# Patient Record
Sex: Male | Born: 1965 | Hispanic: No | Marital: Single | Smoking: Never smoker
Health system: Southern US, Community
[De-identification: ages and names within clinical notes are randomized; demographics above are authoritative.]

## PROBLEM LIST (undated history)

## (undated) DIAGNOSIS — E785 Hyperlipidemia, unspecified: Secondary | ICD-10-CM

## (undated) DIAGNOSIS — I890 Lymphedema, not elsewhere classified: Secondary | ICD-10-CM

## (undated) DIAGNOSIS — I10 Essential (primary) hypertension: Secondary | ICD-10-CM

## (undated) HISTORY — PX: NO PAST SURGERIES: SHX2092

## (undated) HISTORY — DX: Essential (primary) hypertension: I10

## (undated) HISTORY — DX: Hyperlipidemia, unspecified: E78.5

---

## 2004-01-14 ENCOUNTER — Emergency Department (HOSPITAL_COMMUNITY): Admission: EM | Admit: 2004-01-14 | Discharge: 2004-01-14 | Payer: Self-pay | Admitting: Emergency Medicine

## 2006-11-21 ENCOUNTER — Emergency Department (HOSPITAL_COMMUNITY): Admission: EM | Admit: 2006-11-21 | Discharge: 2006-11-21 | Payer: Self-pay | Admitting: Emergency Medicine

## 2008-02-28 DIAGNOSIS — I10 Essential (primary) hypertension: Secondary | ICD-10-CM

## 2008-02-28 HISTORY — DX: Essential (primary) hypertension: I10

## 2008-07-14 ENCOUNTER — Ambulatory Visit: Payer: Self-pay | Admitting: Vascular Surgery

## 2008-07-14 ENCOUNTER — Observation Stay (HOSPITAL_COMMUNITY): Admission: EM | Admit: 2008-07-14 | Discharge: 2008-07-16 | Payer: Self-pay | Admitting: Emergency Medicine

## 2008-07-14 ENCOUNTER — Encounter (INDEPENDENT_AMBULATORY_CARE_PROVIDER_SITE_OTHER): Payer: Self-pay | Admitting: Emergency Medicine

## 2009-01-07 ENCOUNTER — Emergency Department (HOSPITAL_COMMUNITY): Admission: EM | Admit: 2009-01-07 | Discharge: 2009-01-07 | Payer: Self-pay | Admitting: Emergency Medicine

## 2009-02-27 DIAGNOSIS — E785 Hyperlipidemia, unspecified: Secondary | ICD-10-CM

## 2009-02-27 HISTORY — DX: Hyperlipidemia, unspecified: E78.5

## 2009-06-24 ENCOUNTER — Emergency Department (HOSPITAL_COMMUNITY): Admission: EM | Admit: 2009-06-24 | Discharge: 2009-06-24 | Payer: Self-pay | Admitting: Emergency Medicine

## 2009-09-28 ENCOUNTER — Emergency Department (HOSPITAL_COMMUNITY): Admission: EM | Admit: 2009-09-28 | Discharge: 2009-09-28 | Payer: Self-pay | Admitting: Emergency Medicine

## 2009-10-12 ENCOUNTER — Observation Stay (HOSPITAL_COMMUNITY): Admission: EM | Admit: 2009-10-12 | Discharge: 2009-10-13 | Payer: Self-pay | Admitting: Emergency Medicine

## 2009-10-13 ENCOUNTER — Ambulatory Visit: Payer: Self-pay | Admitting: Vascular Surgery

## 2009-10-13 ENCOUNTER — Encounter (INDEPENDENT_AMBULATORY_CARE_PROVIDER_SITE_OTHER): Payer: Self-pay | Admitting: Emergency Medicine

## 2009-10-16 ENCOUNTER — Emergency Department (HOSPITAL_COMMUNITY): Admission: EM | Admit: 2009-10-16 | Discharge: 2009-10-16 | Payer: Self-pay | Admitting: Emergency Medicine

## 2010-05-13 LAB — DIFFERENTIAL
Basophils Relative: 0 % (ref 0–1)
Eosinophils Absolute: 0 10*3/uL (ref 0.0–0.7)
Eosinophils Absolute: 0 10*3/uL (ref 0.0–0.7)
Eosinophils Relative: 0 % (ref 0–5)
Eosinophils Relative: 0 % (ref 0–5)
Lymphocytes Relative: 10 % — ABNORMAL LOW (ref 12–46)
Lymphocytes Relative: 8 % — ABNORMAL LOW (ref 12–46)
Lymphs Abs: 1.5 10*3/uL (ref 0.7–4.0)
Monocytes Absolute: 0.9 10*3/uL (ref 0.1–1.0)
Monocytes Relative: 5 % (ref 3–12)
Neutro Abs: 14 10*3/uL — ABNORMAL HIGH (ref 1.7–7.7)
Neutrophils Relative %: 85 % — ABNORMAL HIGH (ref 43–77)

## 2010-05-13 LAB — POCT I-STAT, CHEM 8
BUN: 19 mg/dL (ref 6–23)
Calcium, Ion: 0.95 mmol/L — ABNORMAL LOW (ref 1.12–1.32)
Creatinine, Ser: 1.6 mg/dL — ABNORMAL HIGH (ref 0.4–1.5)
Glucose, Bld: 117 mg/dL — ABNORMAL HIGH (ref 70–99)
Hemoglobin: 15 g/dL (ref 13.0–17.0)
TCO2: 21 mmol/L (ref 0–100)

## 2010-05-13 LAB — BASIC METABOLIC PANEL
CO2: 27 mEq/L (ref 19–32)
GFR calc Af Amer: >60 mL/min (ref >60)
GFR calc non Af Amer: 58 mL/min — ABNORMAL LOW (ref >60)
Glucose, Bld: 97 mg/dL (ref 70–99)
Potassium: 3.9 mEq/L (ref 3.5–5.1)
Sodium: 137 mEq/L (ref 135–145)

## 2010-05-13 LAB — CBC
HCT: 39.5 % (ref 39.0–52.0)
HCT: 41.6 % (ref 39.0–52.0)
Hemoglobin: 13.4 g/dL (ref 13.0–17.0)
MCH: 31.3 pg (ref 26.0–34.0)
MCH: 32.3 pg (ref 26.0–34.0)
MCHC: 33.9 g/dL (ref 30.0–36.0)
MCV: 92 fL (ref 78.0–100.0)
Platelets: 155 10*3/uL (ref 150–400)
RBC: 4.28 MIL/uL (ref 4.22–5.81)
RBC: 4.52 MIL/uL (ref 4.22–5.81)
WBC: 19 10*3/uL — ABNORMAL HIGH (ref 4.0–10.5)

## 2010-06-01 LAB — CBC
HCT: 41.8 % (ref 39.0–52.0)
MCHC: 33.8 g/dL (ref 30.0–36.0)
MCV: 91.9 fL (ref 78.0–100.0)
Platelets: 231 10*3/uL (ref 150–400)
RDW: 12.4 % (ref 11.5–15.5)
WBC: 16.1 10*3/uL — ABNORMAL HIGH (ref 4.0–10.5)

## 2010-06-01 LAB — DIFFERENTIAL
Basophils Relative: 0 % (ref 0–1)
Eosinophils Absolute: 0 10*3/uL (ref 0.0–0.7)
Eosinophils Relative: 0 % (ref 0–5)
Lymphs Abs: 0.8 10*3/uL (ref 0.7–4.0)
Neutrophils Relative %: 92 % — ABNORMAL HIGH (ref 43–77)

## 2010-06-07 LAB — CULTURE, BLOOD (ROUTINE X 2): Culture: NO GROWTH

## 2010-06-07 LAB — BASIC METABOLIC PANEL
BUN: 18 mg/dL (ref 6–23)
CO2: 28 mEq/L (ref 19–32)
CO2: 29 mEq/L (ref 19–32)
Calcium: 8.6 mg/dL (ref 8.4–10.5)
Calcium: 9.2 mg/dL (ref 8.4–10.5)
Chloride: 106 mEq/L (ref 96–112)
Creatinine, Ser: 1.37 mg/dL (ref 0.4–1.5)
GFR calc Af Amer: >60 mL/min (ref >60)
GFR calc non Af Amer: 56 mL/min — ABNORMAL LOW (ref >60)
GFR calc non Af Amer: >60 mL/min (ref >60)
Glucose, Bld: 100 mg/dL — ABNORMAL HIGH (ref 70–99)
Glucose, Bld: 114 mg/dL — ABNORMAL HIGH (ref 70–99)
Glucose, Bld: 95 mg/dL (ref 70–99)
Potassium: 3.6 mEq/L (ref 3.5–5.1)
Potassium: 4 mEq/L (ref 3.5–5.1)
Sodium: 138 mEq/L (ref 135–145)

## 2010-06-07 LAB — CBC
HCT: 36.1 % — ABNORMAL LOW (ref 39.0–52.0)
HCT: 38.6 % — ABNORMAL LOW (ref 39.0–52.0)
Hemoglobin: 13 g/dL (ref 13.0–17.0)
MCHC: 33.7 g/dL (ref 30.0–36.0)
MCHC: 33.8 g/dL (ref 30.0–36.0)
MCV: 93.4 fL (ref 78.0–100.0)
Platelets: 132 10*3/uL — ABNORMAL LOW (ref 150–400)
RBC: 4.7 MIL/uL (ref 4.22–5.81)
RDW: 13.2 % (ref 11.5–15.5)
RDW: 13.5 % (ref 11.5–15.5)
RDW: 13.6 % (ref 11.5–15.5)

## 2010-06-07 LAB — DIFFERENTIAL
Basophils Absolute: 0 10*3/uL (ref 0.0–0.1)
Basophils Relative: 0 % (ref 0–1)
Lymphocytes Relative: 12 % (ref 12–46)
Neutro Abs: 9.9 10*3/uL — ABNORMAL HIGH (ref 1.7–7.7)
Neutrophils Relative %: 84 % — ABNORMAL HIGH (ref 43–77)

## 2010-07-12 NOTE — Discharge Summary (Signed)
NAMEKALIK, HOARE NO.:  [id number]   MEDICAL RECORD NO.:  [id number]          PATIENT TYPE:  OBV   LOCATION:  1305                         FACILITY:  [facility]   PHYSICIAN:  Marcellus Scott, MD     DATE OF BIRTH:  04-02-1965   DATE OF ADMISSION:  07/14/2008  DATE OF DISCHARGE:  07/16/2008                               DISCHARGE SUMMARY   PRIMARY MEDICAL DOCTOR:  Gentry Fitz.   DISCHARGE DIAGNOSES:  1. Cellulitis of the left lower extremity complicating underlying      lymphedema.  2. Hypokalemia, repleted.  3. Thrombocytopenia, improving.  4. Hypertension, controlled off medications.  5. Lymphedema of the left lower extremity.   DISCHARGE MEDICATIONS:  1. Doxycycline 100 mg p.o. b.i.d. for 1 week.  2. Tylenol 650 mg p.o. every 4 to 6 hours p.r.n.   PROCEDURES:  1. Left lower extremity venous Doppler.  In summary, no evidence of      deep vein or superficial thrombosis involving the left lower      extremity.  An enlarged inguinal lymph node is visualized.  No      evidence of Baker cyst on the left.  2. CT angiogram of the chest on Jul 14, 2008.  Impression:      a.     No evidence of acute pulmonary embolism.      b.     No acute pulmonary parenchymal abnormality.      c.     Lucent lesion within the manubrium is likely benign finding       in patient in this age group.   PERTINENT LABS:  Blood cultures x2 are no growth to date.  Basic  metabolic panel today unremarkable with BUN 18, creatinine 1.20.  CBC is  remarkable for hemoglobin 12.4, hematocrit 36.1, white blood cell 5.9,  platelets 132.   CONSULTATIONS:  None.   HOSPITAL COURSE AND PATIENT DISPOSITION:  Mr. Angerer is a pleasant 45-  year-old male patient with history of left lower extremity lymphedema,  prior history of cellulitis in that leg, hypertension, controlled on  diet alone.  He works at [employer] on [address] [employer],  [place], [place].  He presented with left  lower extremity  redness, swelling, which started on the morning of admission.  He denied  fever, chills, or rigors.  He denied any recent trauma to the leg but  did indicate that he had treated himself for athlete's foot in that leg  a couple of weeks ago.  He had some throbbing pain and difficulty  ambulation.  Venous Doppler of the left lower extremity in the ED was  negative for deep vein thrombosis.  We were asked to admit this patient  for management of cellulitis.  The patient was admitted to the medical  floor.  1. Left lower extremity cellulitis complicating underlying lymphedema.      The patient was admitted to the hospital.  He was not toxic looking      and was hemodynamically stable.  There were no obvious wounds      including  the web spaces.  He was placed on IV vancomycin.  Within      the first 24 hours, there was significant improvement in his      symptoms and signs.  Approximately 48 hours later today, patient      has faint redness below the knee which is circumferential and has      mild warmth.  There is no tenderness.  There is no fluctuance or      crepitus.  Patient has no pain.  He is ambulating in the room.      Physical therapy and occupational therapy have evaluated the      patient and do not see any further needs.  Patient will be      transitioned to p.o. doxycycline to complete an additional week's      course.  We expect full recovery to his baseline.  He has been      advised to return to work on Monday, Jul 20, 2008, if his leg      continues to make improvement and his cellulitis resolves.      However, if there is any worsening of his symptoms, he has been      advised to seek immediate medical attention.  A letter has been      provided to his employer.  2. Hypokalemia, repleted.  3. Thrombocytopenia, improving, ? related to cellulitis.  Recommend      repeating CBC in 1 to 2 weeks to follow up.  4. Hypertension, controlled off medications.  5.  Lymphedema of the left lower extremity.  6. Acute renal insufficiency which has resolved.  ? etiology.  7. Mild anemia.  For outpatient followup and workup as deemed      necessary.   Patient indicates that he lacks insurance and primary medical doctor.  We will have case manager provide him with resources to be seen at  [facility].   TIME TAKEN IN COORDINATING THIS DISCHARGE:  20 minutes.      Marcellus Scott, MD  Electronically Signed     AH/MEDQ  D:  07/16/2008  T:  07/16/2008  Job:  [id number]   cc:   Melvern Banker  Fax: [phone]

## 2010-07-12 NOTE — H&P (Signed)
Brian Wade, [name]:  [id number]   MEDICAL RECORD NO.:  [id number]          PATIENT TYPE:  EMS   LOCATION:  ED                           FACILITY:  [facility]   PHYSICIAN:  Richarda Overlie, MD       DATE OF BIRTH:  10-26-1965   DATE OF ADMISSION:  07/14/2008  DATE OF DISCHARGE:                              HISTORY & PHYSICAL   PRIMARY CARE PHYSICIAN:  Unassigned.   CHIEF COMPLAINT:  Left lower extremity erythema.   SUBJECTIVE:  This is a 45 year old male who presents with a chief  complaint of left lower extremity swelling, erythema, induration which  was fairly acute in onset and started this morning.  He has a history of  lymphedema and had a similar episode 1-1/2 years ago.  He denies any  fever, chills and rigors at home and was found to be afebrile in the ER.  He denies any history of trauma to his leg, denies any history of  diabetes.  Denies any chills or rigors at home.  Denies any history of  DVT in the past.  He describes a throbbing pain and some difficulty with  ambulation.  The patient had a duplex ultrasound of his left lower  extremity in the ER which was essentially negative for DVT on  preliminary evaluation.   PAST MEDICAL HISTORY:  1. History of lymphedema.  2. History of hypertension.   SOCIAL HISTORY:  Nonsmoker.  Denies use of alcohol.  Denies any IV drug  use.   FAMILY HISTORY:  Positive for hypertension in the mother who is still  alive.  No history of dyslipidemia, diabetes.   SURGICAL HISTORY:  None.   HOME MEDICATIONS:  None.   PHYSICAL EXAMINATION:  VITAL SIGNS:  Temperature 98.5, blood pressure  124/86, pulse of 120, respirations 20.  GENERAL:  The patient appears to be comfortable in no acute distress.  HEENT:  Pupils equal and reactive.  Extraocular movements intact.  LUNGS:  Clear to auscultation bilaterally.  No wheezes, crackles or  rhonchi.  CARDIOVASCULAR: Regular rate and rhythm.  No appreciable murmurs, rubs  or  gallops.  ABDOMEN:  Soft, nontender, nondistended.  EXTREMITIES:  Left lower extremity with 2+ pitting edema and erythema  extending from around his ankle all the way to the anterior aspects of  his left knee.  MUSCULOSKELETAL:  The left knee appears to have full range of motion  without any erythema effusion of the left knee.  PSYCHIATRIC:  Appropriate mood and affect.   LABORATORY DATA:  CT scan shows no evidence of acute pulmonary emboli.  BMET:  Sodium 139, potassium 3.3, chloride 102, bicarb 29, glucose 114,  BUN 21, creatinine 1.37, calcium 9.2.  CBC:  WBC 11.8, hemoglobin 14.8,  hematocrit 43.6, platelet count of 134.  Duplex ultrasound:  Formal results still pending.   REVIEW OF SYSTEMS:  10-point review of systems was done as documented as  in HPI.   ASSESSMENT AND PLAN:  Left lower extremity cellulitis.  Do not see any  evidence of crepitus or necrotizing fasciitis.  The patient  is afebrile  currently.  Blood cultures will be obtained.  Duplex ultrasound was  negative for DVT.  We will start the patient on vancomycin.  The patient  can be transitioned to p.o. Bactrim after his erythema and induration  improves and to be continued for the next 10 days.  We will use heparin  for deep vein thrombosis prophylaxis.  He is a full code.      Richarda Overlie, MD  Electronically Signed     NA/MEDQ  D:  07/14/2008  T:  07/14/2008  Job:  [id number]

## 2010-10-20 ENCOUNTER — Emergency Department (HOSPITAL_COMMUNITY): Payer: Self-pay

## 2010-10-20 ENCOUNTER — Emergency Department (HOSPITAL_COMMUNITY)
Admission: EM | Admit: 2010-10-20 | Discharge: 2010-10-20 | Disposition: A | Discharge status: Home / Self Care | Payer: Self-pay | Attending: Emergency Medicine | Admitting: Emergency Medicine

## 2010-10-20 DIAGNOSIS — I872 Venous insufficiency (chronic) (peripheral): Secondary | ICD-10-CM | POA: Insufficient documentation

## 2010-10-20 DIAGNOSIS — M545 Low back pain, unspecified: Secondary | ICD-10-CM | POA: Insufficient documentation

## 2010-10-20 DIAGNOSIS — M79609 Pain in unspecified limb: Secondary | ICD-10-CM | POA: Insufficient documentation

## 2010-10-20 DIAGNOSIS — M51379 Other intervertebral disc degeneration, lumbosacral region without mention of lumbar back pain or lower extremity pain: Secondary | ICD-10-CM | POA: Insufficient documentation

## 2010-10-20 DIAGNOSIS — I10 Essential (primary) hypertension: Secondary | ICD-10-CM | POA: Insufficient documentation

## 2010-10-20 DIAGNOSIS — M5137 Other intervertebral disc degeneration, lumbosacral region: Secondary | ICD-10-CM | POA: Insufficient documentation

## 2010-10-20 LAB — URINALYSIS, ROUTINE W REFLEX MICROSCOPIC
Glucose, UA: NEGATIVE mg/dL
Ketones, ur: NEGATIVE mg/dL
Protein, ur: NEGATIVE mg/dL

## 2010-10-20 LAB — DIFFERENTIAL
Basophils Absolute: 0.1 10*3/uL (ref 0.0–0.1)
Eosinophils Relative: 4 % (ref 0–5)
Lymphocytes Relative: 43 % (ref 12–46)

## 2010-10-20 LAB — CBC
HCT: 44.2 % (ref 39.0–52.0)
Platelets: 213 10*3/uL (ref 150–400)
RDW: 12.4 % (ref 11.5–15.5)
WBC: 6 10*3/uL (ref 4.0–10.5)

## 2010-10-20 LAB — COMPREHENSIVE METABOLIC PANEL
ALT: 28 U/L (ref 0–53)
AST: 26 U/L (ref 0–37)
Albumin: 3.9 g/dL (ref 3.5–5.2)
Alkaline Phosphatase: 79 U/L (ref 39–117)
Chloride: 101 mEq/L (ref 96–112)
Potassium: 4.6 mEq/L (ref 3.5–5.1)
Sodium: 136 mEq/L (ref 135–145)
Total Bilirubin: 0.5 mg/dL (ref 0.3–1.2)

## 2012-01-16 ENCOUNTER — Emergency Department (HOSPITAL_COMMUNITY)
Admission: EM | Admit: 2012-01-16 | Discharge: 2012-01-16 | Disposition: A | Discharge status: Home / Self Care | Payer: Self-pay | Attending: Emergency Medicine | Admitting: Emergency Medicine

## 2012-01-16 ENCOUNTER — Encounter (HOSPITAL_COMMUNITY): Payer: Self-pay | Admitting: Emergency Medicine

## 2012-01-16 DIAGNOSIS — R509 Fever, unspecified: Secondary | ICD-10-CM | POA: Insufficient documentation

## 2012-01-16 DIAGNOSIS — L0390 Cellulitis, unspecified: Secondary | ICD-10-CM

## 2012-01-16 DIAGNOSIS — Z79899 Other long term (current) drug therapy: Secondary | ICD-10-CM | POA: Insufficient documentation

## 2012-01-16 DIAGNOSIS — R1110 Vomiting, unspecified: Secondary | ICD-10-CM | POA: Insufficient documentation

## 2012-01-16 DIAGNOSIS — L02419 Cutaneous abscess of limb, unspecified: Secondary | ICD-10-CM | POA: Insufficient documentation

## 2012-01-16 DIAGNOSIS — R197 Diarrhea, unspecified: Secondary | ICD-10-CM | POA: Insufficient documentation

## 2012-01-16 DIAGNOSIS — Z862 Personal history of diseases of the blood and blood-forming organs and certain disorders involving the immune mechanism: Secondary | ICD-10-CM | POA: Insufficient documentation

## 2012-01-16 HISTORY — DX: Lymphedema, not elsewhere classified: I89.0

## 2012-01-16 MED ORDER — CEPHALEXIN 250 MG PO CAPS
500.0000 mg | ORAL_CAPSULE | Freq: Once | ORAL | Status: AC
  Administered 2012-01-16: 500 mg via ORAL
  Filled 2012-01-16: qty 2

## 2012-01-16 MED ORDER — CEPHALEXIN 500 MG PO CAPS: 500.0000 mg | ORAL_CAPSULE | Freq: Four times a day (QID) | ORAL | Status: DC

## 2012-01-16 MED ORDER — IBUPROFEN 400 MG PO TABS
600.0000 mg | ORAL_TABLET | Freq: Once | ORAL | Status: DC
  Filled 2012-01-16: qty 1

## 2012-01-16 MED ORDER — ACETAMINOPHEN 325 MG PO TABS
975.0000 mg | ORAL_TABLET | Freq: Once | ORAL | Status: AC
  Administered 2012-01-16: 975 mg via ORAL
  Filled 2012-01-16: qty 3

## 2012-01-16 NOTE — ED Provider Notes (Signed)
History   This chart was scribed for Brian Sou, MD by Brian Wade, ED Scribe. This patient was seen in room TR06C/TR06C and the patient's care was started at 2:03PM.   CSN: [id number]  Arrival date & time 01/16/12  1233   First MD Initiated Contact with Patient 01/16/12 1403      No chief complaint on file.    The history is provided by the patient. No language interpreter was used.    Brian Wade is a 46 y.o. male who presents to the Emergency Department complaining of redness in the left lower leg starting 1-2 day ago. Pt states having associated pain in the groin area. Pt is positive for fever, chills, diarrhea, 1 episode vomiting yesterday morning. Pt is negative for headaches, nausea, sore throat. Pt has h/o lymphedema since 2005 and chronic venous insufficiency and reports prior episodes of similar symptoms diagnosed as cellulitis. He has been treated with Keflex and Doxycycline in the past with improvement. He denies taking any over the counter medications to improve his symptoms. Patient had subjective fever yesterday. He feels much improved today over yesterday, without treatment. Denies nausea at present pain at left leg is 4 on a scale of 1-10, nonradiating  Pt's PCP is Dr. [name].   Past Medical History  Diagnosis Date  . Lymphedema     No past surgical history on file.  No family history on file.  History  Substance Use Topics  . Smoking status: Never Smoker   . Smokeless tobacco: Not on file  . Alcohol Use: No      Review of Systems  Constitutional: Positive for fever and chills.  HENT: Negative.  Negative for sore throat.   Respiratory: Negative.   Cardiovascular: Negative.   Gastrointestinal: Positive for vomiting and diarrhea. Negative for abdominal pain.  Musculoskeletal: Negative.   Skin: Negative.   Neurological: Negative.   Hematological: Negative.   Psychiatric/Behavioral: Negative.     Allergies  Penicillins  Home Medications    Current Outpatient Rx  Name  Route  Sig  Dispense  Refill  . AMLODIPINE BESYLATE 10 MG PO TABS   Oral   Take 10 mg by mouth daily.         . ATENOLOL 25 MG PO TABS   Oral   Take 25 mg by mouth daily.         Marland Kitchen HYDROCHLOROTHIAZIDE 25 MG PO TABS   Oral   Take 25 mg by mouth daily.         Marland Kitchen LOVASTATIN 20 MG PO TABS   Oral   Take 20 mg by mouth daily.           BP 135/89  Pulse 89  Temp 98.1 F (36.7 C)  Resp 16  Physical Exam  Nursing note and vitals reviewed. Constitutional: He appears well-developed and well-nourished.  HENT:  Head: Normocephalic and atraumatic.  Right Ear: External ear normal.  Left Ear: External ear normal.  Eyes: Conjunctivae normal are normal. Pupils are equal, round, and reactive to light.  Neck: Neck supple. No tracheal deviation present. No thyromegaly present.  Cardiovascular: Normal rate, regular rhythm and normal heart sounds.   No murmur heard. Pulmonary/Chest: Effort normal and breath sounds normal.  Abdominal: Soft. Bowel sounds are normal. He exhibits no distension. There is no tenderness.  Musculoskeletal: Normal range of motion. He exhibits no edema and no tenderness.       Redness to the distal 2/3 of lower leg.  dp and  pt pulses are 2+ Tender inguinal nodes on the left.  No red streaking of the leg.   Neurological: He is alert. Coordination normal.  Skin: Skin is warm and dry. No rash noted. There is erythema.       Erythematous left leg otherwise without rash  Psychiatric: He has a normal mood and affect.    ED Course  Procedures (including critical care time)  DIAGNOSTIC STUDIES: None performed.   COORDINATION OF CARE: 2:15 PM Discussed treatment plan which includes use of Ibuprofen and antibiotics with pt at bedside and pt agreed to plan.    Labs Reviewed - No data to display No results found.   No diagnosis found.    MDM  Plan prescription Keflex, Tylenol, elevation recheck 3 days as  needed Diagnosis cellulitis left leg       I personally performed the services described in this documentation, which was scribed in my presence. The recorded information has been reviewed and is accurate.    Brian Sou, MD 01/16/12 1426

## 2012-01-16 NOTE — ED Notes (Signed)
Left lower leg has been getting red since yesterday  Had a fever and chills diarrhea and vomitied x 1 yesterday but not today  Has lymphadema in lower legs he staes

## 2012-01-16 NOTE — ED Notes (Signed)
Pt lymphedema left leg and states periodically gets cellulitis in leg, left lower leg with redness across top of leg and calf, states in past has been on IV antibiotics and also pills, states fever and chills yesterday, feels better today

## 2013-02-17 ENCOUNTER — Emergency Department (HOSPITAL_COMMUNITY): Payer: Self-pay

## 2013-02-17 ENCOUNTER — Encounter (HOSPITAL_COMMUNITY): Payer: Self-pay | Admitting: Emergency Medicine

## 2013-02-17 ENCOUNTER — Inpatient Hospital Stay (HOSPITAL_COMMUNITY)
Admission: EM | Admit: 2013-02-17 | Discharge: 2013-02-19 | DRG: 603 | Disposition: A | Discharge status: Home / Self Care | Payer: Self-pay | Attending: Internal Medicine | Admitting: Internal Medicine

## 2013-02-17 DIAGNOSIS — I890 Lymphedema, not elsewhere classified: Secondary | ICD-10-CM | POA: Diagnosis present

## 2013-02-17 DIAGNOSIS — E785 Hyperlipidemia, unspecified: Secondary | ICD-10-CM | POA: Diagnosis present

## 2013-02-17 DIAGNOSIS — R112 Nausea with vomiting, unspecified: Secondary | ICD-10-CM

## 2013-02-17 DIAGNOSIS — N179 Acute kidney failure, unspecified: Secondary | ICD-10-CM | POA: Diagnosis present

## 2013-02-17 DIAGNOSIS — N1330 Unspecified hydronephrosis: Secondary | ICD-10-CM | POA: Diagnosis present

## 2013-02-17 DIAGNOSIS — I809 Phlebitis and thrombophlebitis of unspecified site: Secondary | ICD-10-CM | POA: Diagnosis present

## 2013-02-17 DIAGNOSIS — I891 Lymphangitis: Secondary | ICD-10-CM | POA: Diagnosis present

## 2013-02-17 DIAGNOSIS — L02419 Cutaneous abscess of limb, unspecified: Principal | ICD-10-CM | POA: Diagnosis present

## 2013-02-17 DIAGNOSIS — Z79899 Other long term (current) drug therapy: Secondary | ICD-10-CM

## 2013-02-17 DIAGNOSIS — I10 Essential (primary) hypertension: Secondary | ICD-10-CM | POA: Diagnosis present

## 2013-02-17 DIAGNOSIS — L0390 Cellulitis, unspecified: Secondary | ICD-10-CM

## 2013-02-17 DIAGNOSIS — D72829 Elevated white blood cell count, unspecified: Secondary | ICD-10-CM | POA: Diagnosis present

## 2013-02-17 DIAGNOSIS — K5289 Other specified noninfective gastroenteritis and colitis: Secondary | ICD-10-CM | POA: Diagnosis present

## 2013-02-17 LAB — COMPREHENSIVE METABOLIC PANEL
Albumin: 3.6 g/dL (ref 3.5–5.2)
BUN: 15 mg/dL (ref 6–23)
Chloride: 96 mEq/L (ref 96–112)
Creatinine, Ser: 1.47 mg/dL — ABNORMAL HIGH (ref 0.50–1.35)
GFR calc non Af Amer: 54 mL/min — ABNORMAL LOW (ref >90)
Total Bilirubin: 1.5 mg/dL — ABNORMAL HIGH (ref 0.3–1.2)

## 2013-02-17 LAB — URINALYSIS, ROUTINE W REFLEX MICROSCOPIC
Glucose, UA: NEGATIVE mg/dL
Protein, ur: 100 mg/dL — AB
Urobilinogen, UA: 1 mg/dL (ref 0.0–1.0)

## 2013-02-17 LAB — CBC WITH DIFFERENTIAL/PLATELET
Basophils Relative: 0 % (ref 0–1)
Eosinophils Relative: 0 % (ref 0–5)
HCT: 43.6 % (ref 39.0–52.0)
Hemoglobin: 15.1 g/dL (ref 13.0–17.0)
MCH: 31.8 pg (ref 26.0–34.0)
MCHC: 34.6 g/dL (ref 30.0–36.0)
MCV: 91.8 fL (ref 78.0–100.0)
Monocytes Absolute: 0.7 10*3/uL (ref 0.1–1.0)
Monocytes Relative: 4 % (ref 3–12)
Neutro Abs: 15.1 10*3/uL — ABNORMAL HIGH (ref 1.7–7.7)

## 2013-02-17 LAB — CG4 I-STAT (LACTIC ACID): Lactic Acid, Venous: 2.47 mmol/L — ABNORMAL HIGH (ref 0.5–2.2)

## 2013-02-17 MED ORDER — ONDANSETRON HCL 4 MG/2ML IJ SOLN: 4.0000 mg | Freq: Three times a day (TID) | INTRAMUSCULAR | Status: DC | PRN

## 2013-02-17 MED ORDER — IOHEXOL 300 MG/ML  SOLN
25.0000 mL | INTRAMUSCULAR | Status: AC
  Administered 2013-02-17: 25 mL via ORAL

## 2013-02-17 MED ORDER — SODIUM CHLORIDE 0.9 % IV SOLN
INTRAVENOUS | Status: DC
  Administered 2013-02-17: via INTRAVENOUS

## 2013-02-17 MED ORDER — VANCOMYCIN HCL IN DEXTROSE 1-5 GM/200ML-% IV SOLN
1000.0000 mg | Freq: Once | INTRAVENOUS | Status: AC
  Administered 2013-02-17: 1000 mg via INTRAVENOUS
  Filled 2013-02-17: qty 200

## 2013-02-17 MED ORDER — IOHEXOL 300 MG/ML  SOLN
100.0000 mL | Freq: Once | INTRAMUSCULAR | Status: AC | PRN
  Administered 2013-02-17: 100 mL via INTRAVENOUS

## 2013-02-17 MED ORDER — SODIUM CHLORIDE 0.9 % IV BOLUS (SEPSIS)
1000.0000 mL | Freq: Once | INTRAVENOUS | Status: AC
  Administered 2013-02-17: 1000 mL via INTRAVENOUS

## 2013-02-17 MED ORDER — ONDANSETRON HCL 4 MG/2ML IJ SOLN
4.0000 mg | Freq: Once | INTRAMUSCULAR | Status: AC
  Administered 2013-02-17: 4 mg via INTRAVENOUS
  Filled 2013-02-17: qty 2

## 2013-02-17 NOTE — ED Notes (Signed)
Pt taken to ct 

## 2013-02-17 NOTE — ED Notes (Signed)
Pt back from ct

## 2013-02-17 NOTE — ED Provider Notes (Signed)
CSN: [id number]     Arrival date & time 02/17/13  1712 History   First MD Initiated Contact with Patient 02/17/13 2021     Chief Complaint  Patient presents with  . Diarrhea  . LFT leg infection    (Consider location/radiation/quality/duration/timing/severity/associated sxs/prior Treatment) HPI Comments: Patient presents with diarrhea, nausea and vomiting for the past 5 days. He states unable to keep anything down. 3 episodes of vomiting today with nonbloody stools. Today he'll status redness and swelling to his left leg from the knee down with enlargement of his lymph nodes in his groin. History of lymphedema in the same leg. Denies any fevers, chills, recent travel. No sick contacts. No recent antibiotic use. Denies any chest pain, shortness of breath, lightheadedness or dizziness.  The history is provided by the patient.    Past Medical History  Diagnosis Date  . Lymphedema    Past Surgical History  Procedure Laterality Date  . No past surgeries     Family History  Problem Relation Age of Onset  . Asthma Sister    History  Substance Use Topics  . Smoking status: Never Smoker   . Smokeless tobacco: Not on file  . Alcohol Use: No    Review of Systems  Constitutional: Positive for activity change, appetite change and fatigue. Negative for fever.  Respiratory: Negative for cough, chest tightness and shortness of breath.   Cardiovascular: Positive for leg swelling. Negative for chest pain.  Gastrointestinal: Positive for nausea, vomiting and diarrhea. Negative for abdominal pain.  Genitourinary: Negative for dysuria and hematuria.  Musculoskeletal: Negative for back pain.  Skin: Positive for rash. Negative for wound.  Neurological: Positive for weakness. Negative for dizziness and headaches.  A complete 10 system review of systems was obtained and all systems are negative except as noted in the HPI and PMH.    Allergies  Penicillins  Home Medications   Current  Outpatient Rx  Name  Route  Sig  Dispense  Refill  . amLODipine (NORVASC) 10 MG tablet   Oral   Take 10 mg by mouth daily.         Marland Kitchen atenolol (TENORMIN) 25 MG tablet   Oral   Take 25 mg by mouth daily.         . hydrochlorothiazide (HYDRODIURIL) 25 MG tablet   Oral   Take 25 mg by mouth daily.         Marland Kitchen lovastatin (MEVACOR) 20 MG tablet   Oral   Take 20 mg by mouth daily.          BP 111/69  Pulse 76  Temp(Src) 98.2 F (36.8 C) (Oral)  Resp 18  SpO2 98% Physical Exam  Constitutional: He appears well-developed and well-nourished. No distress.  HENT:  Head: Normocephalic and atraumatic.  Mouth/Throat: Oropharynx is clear and moist. No oropharyngeal exudate.  Eyes: Conjunctivae and EOM are normal. Pupils are equal, round, and reactive to light.  Neck: Normal range of motion. Neck supple.  Cardiovascular: Normal rate, regular rhythm and normal heart sounds.   No murmur heard. Pulmonary/Chest: Effort normal and breath sounds normal. No respiratory distress.  Abdominal: Soft. There is tenderness. There is no rebound and no guarding.  TTP LLQ without gurading  Musculoskeletal: Normal range of motion. He exhibits edema. He exhibits no tenderness.  Circumferential edema and erythema to the left leg from foot to knee. Intact distal pulses Maceration between toes.   Tender left inguinal lymphadenopathy. Streaking erythema of proximal thigh.  Neurological:  He is alert. No cranial nerve deficit. He exhibits normal muscle tone. Coordination normal.  Skin: Skin is warm.    ED Course  Procedures (including critical care time) Labs Review Labs Reviewed  CBC WITH DIFFERENTIAL - Abnormal; Notable for the following:    WBC 17.9 (*)    Neutrophils Relative % 84 (*)    Neutro Abs 15.1 (*)    All other components within normal limits  COMPREHENSIVE METABOLIC PANEL - Abnormal; Notable for the following:    Sodium 134 (*)    Glucose, Bld 100 (*)    Creatinine, Ser 1.47 (*)     AST 49 (*)    Total Bilirubin 1.5 (*)    GFR calc non Af Amer 54 (*)    GFR calc Af Amer 63 (*)    All other components within normal limits  URINALYSIS, ROUTINE W REFLEX MICROSCOPIC - Abnormal; Notable for the following:    Color, Urine ORANGE (*)    APPearance CLOUDY (*)    Hgb urine dipstick LARGE (*)    Bilirubin Urine SMALL (*)    Ketones, ur 15 (*)    Protein, ur 100 (*)    Leukocytes, UA TRACE (*)    All other components within normal limits  URINE MICROSCOPIC-ADD ON - Abnormal; Notable for the following:    Bacteria, UA FEW (*)    Casts HYALINE CASTS (*)    All other components within normal limits  CG4 I-STAT (LACTIC ACID) - Abnormal; Notable for the following:    Lactic Acid, Venous 2.47 (*)    All other components within normal limits   Imaging Review Ct Abdomen Pelvis W Contrast  02/17/2013   CLINICAL DATA:  Diarrhea and vomiting. Left leg swelling and erythema.  EXAM: CT ABDOMEN AND PELVIS WITH CONTRAST  TECHNIQUE: Multidetector CT imaging of the abdomen and pelvis was performed using the standard protocol following bolus administration of intravenous contrast.  CONTRAST:  OMNIPAQUE IOHEXOL 300 MG/ML  SOLN  COMPARISON:  MRI of the lumbar spine performed 10/20/2010  FINDINGS: The visualized lung bases are clear.  The liver and spleen are unremarkable in appearance. The gallbladder is within normal limits. The pancreas and adrenal glands are unremarkable.  There is minimal left-sided hydronephrosis as described below. There is developmental atrophy of the right kidney, with minimal associated calcifications. The left kidney is otherwise unremarkable in appearance. No perinephric stranding is seen. No renal or ureteral stones are identified.  No free fluid is identified. The small bowel is unremarkable in appearance. The stomach is within normal limits. No acute vascular abnormalities are seen. Scattered periaortic and mesenteric nodes are borderline normal in size.  Minimal calcification is noted along the abdominal aorta.  The appendix is normal in caliber, without evidence for appendicitis. The colon is unremarkable in appearance.  The bladder is mildly distended and grossly unremarkable. A small urachal remnant is incidentally seen. The prostate remains normal in size. A small to moderate left inguinal hernia is noted, containing only fat.  There is soft tissue inflammation about the course of the left femoral artery and vein, extending superiorly along the course of the left external iliac artery and vein, with trace associated free fluid. As there is no evidence of thrombus, this is most compatible with extension of left-sided phlebitis. Asymmetrically prominent left inguinal nodes are seen, measuring up to 1.4 cm in short axis, with mild associated soft tissue inflammation.  The soft tissue inflammation tracks about the course of the left  ureter; though the left ureter remains normal in caliber, there is associated minimal left-sided hydronephrosis.  No acute osseous abnormalities are identified.  IMPRESSION: 1. Soft tissue inflammation about the course of the left femoral artery and vein, extending superiorly along the left external iliac artery and vein, with trace associated free fluid. This is most compatible with extension of phlebitis from the left leg. No evidence of thrombosis at this time. 2. Left inguinal lymphadenopathy, measuring up to 1.4 cm in short axis, with mild associated soft tissue inflammation; this likely reflects the underlying phlebitis. 3. Soft tissue inflammation tracks about the course of the left ureter; though the left ureter remains normal in caliber, there is associated minimal left-sided hydronephrosis. No obstructing ureteral stones seen. 4. Developmental atrophy of the right kidney. 5. Small to moderate left inguinal hernia, containing only fat.   Electronically Signed   By: Roanna Raider M.D.   On: 02/17/2013 22:37    EKG  Interpretation   None       MDM   1. Cellulitis   2. Lymphangitis   3. ARF (acute renal failure)   4. HTN (hypertension)   5. Nausea vomiting and diarrhea    Cellulitis and lymphangitis of left leg. 5 day history of nausea vomiting and diarrhea. Abdomen is soft and nontender on exam.  Patient started on IV antibiotics for cellulitis of his leg. He also has tender lymphadenopathy with streaking lymphangitis. Given his ongoing diarrhea CT abdomen was obtained. Findings are consistent with phlebitis of the left femoral vessels and left inguinal lymphadenopathy.  Patient will be admitted for IV antibiotics and IV fluids.    Glynn Octave, MD 02/18/13 [phone]

## 2013-02-17 NOTE — ED Notes (Signed)
Pt states diarrhea for over one week and has been vomiting.  Pt reports swelling, redness to left entire leg and feels like lymph nodes are enlarged.  Pulse present in LLE

## 2013-02-18 ENCOUNTER — Encounter (HOSPITAL_COMMUNITY): Payer: Self-pay | Admitting: Internal Medicine

## 2013-02-18 DIAGNOSIS — L0390 Cellulitis, unspecified: Secondary | ICD-10-CM | POA: Diagnosis present

## 2013-02-18 DIAGNOSIS — N179 Acute kidney failure, unspecified: Secondary | ICD-10-CM

## 2013-02-18 DIAGNOSIS — E785 Hyperlipidemia, unspecified: Secondary | ICD-10-CM | POA: Diagnosis present

## 2013-02-18 DIAGNOSIS — R197 Diarrhea, unspecified: Secondary | ICD-10-CM

## 2013-02-18 DIAGNOSIS — L0291 Cutaneous abscess, unspecified: Secondary | ICD-10-CM

## 2013-02-18 DIAGNOSIS — I10 Essential (primary) hypertension: Secondary | ICD-10-CM | POA: Diagnosis present

## 2013-02-18 DIAGNOSIS — R112 Nausea with vomiting, unspecified: Secondary | ICD-10-CM | POA: Diagnosis present

## 2013-02-18 DIAGNOSIS — R609 Edema, unspecified: Secondary | ICD-10-CM

## 2013-02-18 DIAGNOSIS — I891 Lymphangitis: Secondary | ICD-10-CM

## 2013-02-18 LAB — BASIC METABOLIC PANEL
BUN: 14 mg/dL (ref 6–23)
Calcium: 8.5 mg/dL (ref 8.4–10.5)
Creatinine, Ser: 1.42 mg/dL — ABNORMAL HIGH (ref 0.50–1.35)
GFR calc Af Amer: 66 mL/min — ABNORMAL LOW (ref >90)
GFR calc non Af Amer: 57 mL/min — ABNORMAL LOW (ref >90)

## 2013-02-18 LAB — CBC WITH DIFFERENTIAL/PLATELET
Basophils Absolute: 0 10*3/uL (ref 0.0–0.1)
Basophils Relative: 0 % (ref 0–1)
Eosinophils Absolute: 0 10*3/uL (ref 0.0–0.7)
Eosinophils Relative: 0 % (ref 0–5)
HCT: 37.7 % — ABNORMAL LOW (ref 39.0–52.0)
MCHC: 35 g/dL (ref 30.0–36.0)
MCV: 90.8 fL (ref 78.0–100.0)
Monocytes Absolute: 0.7 10*3/uL (ref 0.1–1.0)
Neutrophils Relative %: 83 % — ABNORMAL HIGH (ref 43–77)
RDW: 12.8 % (ref 11.5–15.5)

## 2013-02-18 LAB — HEPATITIS PANEL, ACUTE
HCV Ab: NEGATIVE
Hep A IgM: NONREACTIVE
Hep B C IgM: NONREACTIVE
Hepatitis B Surface Ag: NEGATIVE

## 2013-02-18 LAB — CREATININE, SERUM
Creatinine, Ser: 1.46 mg/dL — ABNORMAL HIGH (ref 0.50–1.35)
GFR calc non Af Amer: 55 mL/min — ABNORMAL LOW (ref >90)

## 2013-02-18 MED ORDER — AMLODIPINE BESYLATE 10 MG PO TABS
10.0000 mg | ORAL_TABLET | Freq: Every day | ORAL | Status: DC
  Administered 2013-02-18 – 2013-02-19 (×2): 10 mg via ORAL
  Filled 2013-02-18 (×2): qty 1

## 2013-02-18 MED ORDER — ACETAMINOPHEN 650 MG RE SUPP: 650.0000 mg | Freq: Four times a day (QID) | RECTAL | Status: DC | PRN

## 2013-02-18 MED ORDER — ONDANSETRON HCL 4 MG/2ML IJ SOLN: 4.0000 mg | Freq: Four times a day (QID) | INTRAMUSCULAR | Status: DC | PRN

## 2013-02-18 MED ORDER — VANCOMYCIN HCL IN DEXTROSE 1-5 GM/200ML-% IV SOLN
1000.0000 mg | Freq: Two times a day (BID) | INTRAVENOUS | Status: DC
  Administered 2013-02-18 – 2013-02-19 (×4): 1000 mg via INTRAVENOUS
  Filled 2013-02-18 (×5): qty 200

## 2013-02-18 MED ORDER — SIMVASTATIN 10 MG PO TABS
10.0000 mg | ORAL_TABLET | Freq: Every day | ORAL | Status: DC
  Administered 2013-02-18: 10 mg via ORAL
  Filled 2013-02-18 (×2): qty 1

## 2013-02-18 MED ORDER — SODIUM CHLORIDE 0.9 % IJ SOLN
3.0000 mL | Freq: Two times a day (BID) | INTRAMUSCULAR | Status: DC
  Administered 2013-02-18: 3 mL via INTRAVENOUS

## 2013-02-18 MED ORDER — SODIUM CHLORIDE 0.9 % IV SOLN
INTRAVENOUS | Status: AC
  Administered 2013-02-18: 02:00:00 via INTRAVENOUS

## 2013-02-18 MED ORDER — OXYCODONE HCL 5 MG PO TABS
5.0000 mg | ORAL_TABLET | ORAL | Status: DC | PRN
  Administered 2013-02-18: 5 mg via ORAL
  Filled 2013-02-18: qty 1

## 2013-02-18 MED ORDER — ATENOLOL 25 MG PO TABS
25.0000 mg | ORAL_TABLET | Freq: Every day | ORAL | Status: DC
  Administered 2013-02-18 – 2013-02-19 (×2): 25 mg via ORAL
  Filled 2013-02-18 (×2): qty 1

## 2013-02-18 MED ORDER — ONDANSETRON HCL 4 MG PO TABS: 4.0000 mg | ORAL_TABLET | Freq: Four times a day (QID) | ORAL | Status: DC | PRN

## 2013-02-18 MED ORDER — ACETAMINOPHEN 325 MG PO TABS: 650.0000 mg | ORAL_TABLET | Freq: Four times a day (QID) | ORAL | Status: DC | PRN

## 2013-02-18 MED ORDER — LOPERAMIDE HCL 2 MG PO CAPS
2.0000 mg | ORAL_CAPSULE | ORAL | Status: DC | PRN
  Filled 2013-02-18: qty 1

## 2013-02-18 MED ORDER — ENOXAPARIN SODIUM 40 MG/0.4ML ~~LOC~~ SOLN
40.0000 mg | SUBCUTANEOUS | Status: DC
  Administered 2013-02-18 – 2013-02-19 (×2): 40 mg via SUBCUTANEOUS
  Filled 2013-02-18 (×2): qty 0.4

## 2013-02-18 NOTE — Progress Notes (Signed)
*  PRELIMINARY RESULTS* Vascular Ultrasound Left lower extremity venous duplex has been completed.  Preliminary findings: no evidence of DVT   Farrel Demark, RDMS, RVT  02/18/2013, 4:47 PM

## 2013-02-18 NOTE — Progress Notes (Signed)
I have seen and assessed patient and agree with Dr. Katherene Ponto assessment and plan. Lower extremity Dopplers were negative for DVT. Clinical improvement. Continue empiric IV antibiotics.

## 2013-02-18 NOTE — H&P (Addendum)
[facility] History and Physical  Brian Wade XBJ:[id number] DOB: [birth year] DOA: 02/17/2013  Referring physician: ER physician. PCP: No PCP Per Patient the [facility] clinic.  Chief Complaint: Left lower extremity erythema and diarrhea.  HPI: Brian Wade is a 47 y.o. male with history of hypertension and hyperlipidemia and chronic lymphedema of the lower extremity started experiencing nausea vomiting and diarrhea 4 days ago after having fried chicken. Night before last patient started developing erythema of the left lower is ready with fever and chills. Patient also had nausea vomiting and that time. Patient also had abdominal pain in the lower quadrants. In the ER CT abdomen and pelvis shows changes concerning for phlebitis along the femoral vessels. There was also mild left-sided hydronephrosis with no obvious obstruction. On exam patient had erythema of the left lower extremity. Labs reveal leukocytosis with elevated lactic acid level. Patient has been demented for cellulitis with diarrhea. Patient denies any chest pain shortness of breath productive cough. Denies any headache or visual symptoms or any focal deficits.   Review of Systems: As presented in the history of presenting illness, rest negative.  Past Medical History  Diagnosis Date  . Lymphedema    Past Surgical History  Procedure Laterality Date  . No past surgeries     Social History:  reports that he has never smoked. He does not have any smokeless tobacco history on file. He reports that he does not drink alcohol or use illicit drugs. Where does patient live home. Can patient participate in ADLs? Yes.  Allergies  Allergen Reactions  . Penicillins     Family History:  Family History  Problem Relation Age of Onset  . Asthma Sister       Prior to Admission medications   Medication Sig Start Date End Date Taking? Authorizing Provider  amLODipine (NORVASC) 10 MG tablet Take 10 mg by mouth daily.   Yes Historical  Provider, MD  atenolol (TENORMIN) 25 MG tablet Take 25 mg by mouth daily.   Yes Historical Provider, MD  hydrochlorothiazide (HYDRODIURIL) 25 MG tablet Take 25 mg by mouth daily.   Yes Historical Provider, MD  lovastatin (MEVACOR) 20 MG tablet Take 20 mg by mouth daily.   Yes Historical Provider, MD    Physical Exam: Filed Vitals:   02/17/13 2045 02/17/13 2219 02/17/13 2220 02/17/13 2300  BP: 104/73 104/73  111/69  Pulse: 73  79 76  Temp:      TempSrc:      Resp:      SpO2: 98%  98% 98%     General:  Well-developed and nourished.  Eyes: Anicteric no pallor.  ENT: No discharge from the ears eyes nose mouth.  Neck: No mass felt.  Cardiovascular: S1-S2 heard.  Respiratory: No rhonchi or crepitations.  Abdomen: Soft nontender bowel sounds present.  Skin: Erythema of the left lower extremity extending from the foot to the left knee.  Musculoskeletal: Swelling of the left lower extremity. There is small lymph node enlargement in the groin area.  Psychiatric: Is normal.  Neurologic: Alert awake oriented to time place and person. Moves all extremities.  Labs on Admission:  Basic Metabolic Panel:  Recent Labs Lab 02/17/13 1734  NA 134*  K 3.7  CL 96  CO2 25  GLUCOSE 100*  BUN 15  CREATININE 1.47*  CALCIUM 9.2   Liver Function Tests:  Recent Labs Lab 02/17/13 1734  AST 49*  ALT 38  ALKPHOS 71  BILITOT 1.5*  PROT 8.2  ALBUMIN 3.6  No results found for this basename: LIPASE, AMYLASE,  in the last 168 hours No results found for this basename: AMMONIA,  in the last 168 hours CBC:  Recent Labs Lab 02/17/13 1734  WBC 17.9*  NEUTROABS 15.1*  HGB 15.1  HCT 43.6  MCV 91.8  PLT 184   Cardiac Enzymes: No results found for this basename: CKTOTAL, CKMB, CKMBINDEX, TROPONINI,  in the last 168 hours  BNP (last 3 results) No results found for this basename: PROBNP,  in the last 8760 hours CBG: No results found for this basename: GLUCAP,  in the last 168  hours  Radiological Exams on Admission: Ct Abdomen Pelvis W Contrast  02/17/2013   CLINICAL DATA:  Diarrhea and vomiting. Left leg swelling and erythema.  EXAM: CT ABDOMEN AND PELVIS WITH CONTRAST  TECHNIQUE: Multidetector CT imaging of the abdomen and pelvis was performed using the standard protocol following bolus administration of intravenous contrast.  CONTRAST:  OMNIPAQUE IOHEXOL 300 MG/ML  SOLN  COMPARISON:  MRI of the lumbar spine performed 10/20/2010  FINDINGS: The visualized lung bases are clear.  The liver and spleen are unremarkable in appearance. The gallbladder is within normal limits. The pancreas and adrenal glands are unremarkable.  There is minimal left-sided hydronephrosis as described below. There is developmental atrophy of the right kidney, with minimal associated calcifications. The left kidney is otherwise unremarkable in appearance. No perinephric stranding is seen. No renal or ureteral stones are identified.  No free fluid is identified. The small bowel is unremarkable in appearance. The stomach is within normal limits. No acute vascular abnormalities are seen. Scattered periaortic and mesenteric nodes are borderline normal in size. Minimal calcification is noted along the abdominal aorta.  The appendix is normal in caliber, without evidence for appendicitis. The colon is unremarkable in appearance.  The bladder is mildly distended and grossly unremarkable. A small urachal remnant is incidentally seen. The prostate remains normal in size. A small to moderate left inguinal hernia is noted, containing only fat.  There is soft tissue inflammation about the course of the left femoral artery and vein, extending superiorly along the course of the left external iliac artery and vein, with trace associated free fluid. As there is no evidence of thrombus, this is most compatible with extension of left-sided phlebitis. Asymmetrically prominent left inguinal nodes are seen, measuring up to  1.4 cm in short axis, with mild associated soft tissue inflammation.  The soft tissue inflammation tracks about the course of the left ureter; though the left ureter remains normal in caliber, there is associated minimal left-sided hydronephrosis.  No acute osseous abnormalities are identified.  IMPRESSION: 1. Soft tissue inflammation about the course of the left femoral artery and vein, extending superiorly along the left external iliac artery and vein, with trace associated free fluid. This is most compatible with extension of phlebitis from the left leg. No evidence of thrombosis at this time. 2. Left inguinal lymphadenopathy, measuring up to 1.4 cm in short axis, with mild associated soft tissue inflammation; this likely reflects the underlying phlebitis. 3. Soft tissue inflammation tracks about the course of the left ureter; though the left ureter remains normal in caliber, there is associated minimal left-sided hydronephrosis. No obstructing ureteral stones seen. 4. Developmental atrophy of the right kidney. 5. Small to moderate left inguinal hernia, containing only fat.   Electronically Signed   By: Roanna Raider M.D.   On: 02/17/2013 22:37     Assessment/Plan Principal Problem:   Cellulitis Active  Problems:   Lymphangitis   ARF (acute renal failure)   HTN (hypertension)   HLD (hyperlipidemia)   Nausea vomiting and diarrhea   1. Left lower extremity cellulitis with possible lymphangitis and phlebitis - patient has been placed on vancomycin which I have continued. Since patient has phlebitis I have ordered Dopplers of the left lower extremity to rule out DVT. Keep left lower extremity raised. 2. Nausea vomiting and diarrhea - probably secondary to gastroenteritis. Since patient's LFTs are mildly elevated check acute hepatitis panel. Follow LFTs. Check stool studies. 3. Renal failure probably acute - CT shows right renal atrophy and left-sided hydronephrosis. Advised patient to follow with  primary care about the hydronephrosis. Presently patient is getting hydrated and closely follow intake output. Since patient's lactic acid is high and has renal failure I have held hydrochlorothiazide. Urinalysis shows hematuria which will need followup with primary care. 4. Hypertension - holding hydrochlorothiazide due to elevated lactic acid level and renal failure.  5. Hyperlipidemia - continue home medications. 6. Left-sided hydronephrosis and hematuria - will need followup with primary care.    Code Status: Full code.  Family Communication: None.  Disposition Plan: Admit to inpatient.    Janis Sol N. [facility] Pager [phone].  If 7PM-7AM, please contact night-coverage [email or web address] Password [email or web address] 02/18/2013, 12:18 AM       Who

## 2013-02-18 NOTE — Progress Notes (Signed)
ANTIBIOTIC CONSULT NOTE - INITIAL  Pharmacy Consult for vancomycin Indication: cellulitis  Allergies  Allergen Reactions  . Penicillins     Patient Measurements: Height: 5\' 8"  (172.7 cm) Weight: 201 lb 14.4 oz (91.581 kg) IBW/kg (Calculated) : 68.4  Vital Signs: Temp: 98.2 F (36.8 C) (12/22 1717) Temp src: Oral (12/22 1717) BP: 119/60 mmHg (12/23 0100) Pulse Rate: 79 (12/23 0100)  Labs:  Recent Labs  02/17/13 1734  WBC 17.9*  HGB 15.1  PLT 184  CREATININE 1.47*   Estimated Creatinine Clearance: 66.8 ml/min (by C-G formula based on Cr of 1.47).   Microbiology: No results found for this or any previous visit (from the past 720 hour(s)).  Medical History: Past Medical History  Diagnosis Date  . Lymphedema     Medications:  Prescriptions prior to admission  Medication Sig Dispense Refill  . amLODipine (NORVASC) 10 MG tablet Take 10 mg by mouth daily.      Marland Kitchen atenolol (TENORMIN) 25 MG tablet Take 25 mg by mouth daily.      . hydrochlorothiazide (HYDRODIURIL) 25 MG tablet Take 25 mg by mouth daily.      Marland Kitchen lovastatin (MEVACOR) 20 MG tablet Take 20 mg by mouth daily.       Scheduled:  . amLODipine  10 mg Oral Daily  . atenolol  25 mg Oral Daily  . enoxaparin (LOVENOX) injection  40 mg Subcutaneous Q24H  . simvastatin  10 mg Oral q1800  . sodium chloride  3 mL Intravenous Q12H  . vancomycin  1,000 mg Intravenous Q12H   Infusions:  . sodium chloride 100 mL/hr at 02/18/13 0206    Assessment: 47yo male c/o diarrhea and vomiting x1 wk w/ swelling and redness to entire LLE, admitted for cellulitis and lymphangitis, to begin IV ABX.  Goal of Therapy:  Vancomycin trough level 10-15 mcg/ml  Plan:  Will begin vancomycin 1000mg  IV Q12H and monitor CBC, Cx, levels prn.  Vernard Gambles, PharmD, BCPS  02/18/2013,2:36 AM

## 2013-02-18 NOTE — Progress Notes (Signed)
Utilization review completed.  

## 2013-02-18 NOTE — Care Management Note (Signed)
CARE MANAGEMENT NOTE 02/18/2013  Patient:  Brian Wade,Brian Wade   Account Number:  [id number]  Date Initiated:  02/18/2013  Documentation initiated by:  Vance Peper  Subjective/Objective Assessment:   47 yr old male admitted with cellulitis and lymphedema of left lower leg.     Action/Plan:   Case Manager spoke with patient concerning need for medical followup at discharge. Appointment made with [facility] for March 27, 2013 @ 11AM. Patient informed and CM will add to discharge paper.CM will follow   Anticipated DC Date:  02/19/2013   Anticipated DC Plan:        DC Planning Services  [facility]  Follow-up appt scheduled      Choice offered to / List presented to:             Status of service:  In process, will continue to follow

## 2013-02-19 DIAGNOSIS — E785 Hyperlipidemia, unspecified: Secondary | ICD-10-CM

## 2013-02-19 LAB — CBC
MCH: 31.3 pg (ref 26.0–34.0)
MCV: 90.3 fL (ref 78.0–100.0)
Platelets: 159 10*3/uL (ref 150–400)
RBC: 4.03 MIL/uL — ABNORMAL LOW (ref 4.22–5.81)
RDW: 12.3 % (ref 11.5–15.5)
WBC: 7.7 10*3/uL (ref 4.0–10.5)

## 2013-02-19 LAB — BASIC METABOLIC PANEL
Calcium: 8.6 mg/dL (ref 8.4–10.5)
Creatinine, Ser: 1.26 mg/dL (ref 0.50–1.35)
GFR calc non Af Amer: 65 mL/min — ABNORMAL LOW (ref >90)
Glucose, Bld: 99 mg/dL (ref 70–99)
Sodium: 139 mEq/L (ref 135–145)

## 2013-02-19 MED ORDER — POTASSIUM CHLORIDE CRYS ER 20 MEQ PO TBCR
40.0000 meq | EXTENDED_RELEASE_TABLET | Freq: Once | ORAL | Status: AC
  Administered 2013-02-19: 40 meq via ORAL
  Filled 2013-02-19: qty 2

## 2013-02-19 MED ORDER — HYDROCHLOROTHIAZIDE 25 MG PO TABS: 25.0000 mg | ORAL_TABLET | Freq: Every day | ORAL | Status: DC

## 2013-02-19 MED ORDER — CEPHALEXIN 500 MG PO CAPS: 1000.0000 mg | ORAL_CAPSULE | Freq: Two times a day (BID) | ORAL | Status: AC

## 2013-02-19 MED ORDER — OXYCODONE HCL 5 MG PO TABS: 5.0000 mg | ORAL_TABLET | ORAL | Status: DC | PRN

## 2013-02-19 NOTE — Discharge Summary (Signed)
Physician Discharge Summary  [name] ZOX:[id number] DOB: [birth year] DOA: 02/17/2013  PCP: No PCP Per Patient  Admit date: 02/17/2013 Discharge date: 02/19/2013  Time spent: 65 minutes  Recommendations for Outpatient Follow-up:  1. [facility] with [facility] for lower extremity cellulitis. On followup basic metabolic profile need to be obtained to followup on patient's electrolytes and renal function. Minimal left hydronephrosis which was noted on CT scan will also need to be reassessed at that time.  Discharge Diagnoses:  Principal Problem:   Cellulitis Active Problems:   Lymphangitis   ARF (acute renal failure)   HTN (hypertension)   HLD (hyperlipidemia)   Nausea vomiting and diarrhea   Discharge Condition: Stable and improved  Diet recommendation: Regular  Filed Weights   02/18/13 0146  Weight: 91.581 kg (201 lb 14.4 oz)    History of present illness:  Brian Wade is a 47 y.o. male with history of hypertension and hyperlipidemia and chronic lymphedema of the lower extremity started experiencing nausea vomiting and diarrhea 4 days ago after having fried chicken. Night before last patient started developing erythema of the left lower is ready with fever and chills. Patient also had nausea vomiting and that time. Patient also had abdominal pain in the lower quadrants. In the ER CT abdomen and pelvis shows changes concerning for phlebitis along the femoral vessels. There was also mild left-sided hydronephrosis with no obvious obstruction. On exam patient had erythema of the left lower extremity. Labs reveal leukocytosis with elevated lactic acid level. Patient has been demented for cellulitis with diarrhea. Patient denies any chest pain shortness of breath productive cough. Denies any headache or visual symptoms or any focal deficits.    Hospital Course:  #1 left lower extremity cellulitis with possible lymphangitis and phlebitis Clinical improvement. Lower  extremity Dopplers were negative for DVT. Patient was maintained on IV vancomycin. Patient will be discharged home on 9 more days of oral Keflex to complete a ten-day course of antibiotic therapy. Patient will followup with PCP as outpatient.  #2 nausea vomiting diarrhea/probable gastroenteritis Patient had presented with nausea vomiting diarrhea was the likely secondary to gastroenteritis. C. difficile PCR was obtained which was negative. Patient improved clinically. Did not have any further nausea vomiting or diarrhea. Patient be discharged in stable and improved condition.  #3 acute renal failure Felt to be secondary to a prerenal azotemia. CT of the abdomen and pelvis which was done showed a right renal HLD and left-sided hydronephrosis without any obstruction. Patient's hydrochlorothiazide was held. Patient was hydrated with IV fluids. Patient's renal function improved and had resolved by day of discharge.  #4 hypertension Patient's hydrochlorothiazide was held. Patient was maintained on his Norvasc and atenolol with good blood pressure control. Patient resumed HCTZ and 2-3 days. Patient will followup with PCP as outpatient.  #5 hyperlipidemia Patient was maintained on his home regimen.  #6 minimal  left-sided hydronephrosis and hematuria This was noted on CT scan of the abdomen and pelvis which was done on admission. No obstructing ureteral stone noted. Patient's renal function improved with gentle hydration and holding of diuretics. Patient will followup with PCP as outpatient.   Procedures:  Lower extremity Dopplers 02/18/2013  CT abd/pelvis 02/17/13  Consultations:  None  Discharge Exam: Filed Vitals:   02/19/13 1122  BP: 108/72  Pulse:   Temp:   Resp:     General: nad Cardiovascular: rrr Respiratory: ctab Abdomen: Soft, nontender, nondistended, positive bowel sounds Extremities: No clubbing or cyanosis. Left lower extremity erythema decreased.  Warmth decreased. Edema  decreased.  Discharge Instructions  Discharge Orders   Future Appointments Provider Department Dept Phone   03/27/2013 11:00 AM Doris Cheadle, MD [facility] [phone]   Future Orders Complete By Expires   Diet general  As directed    Discharge instructions  As directed    Comments:     Follow up at [facility] in 1 week. Keep LLE elevated above the heart when laying or sitting.   Increase activity slowly  As directed        Medication List         amLODipine 10 MG tablet  Commonly known as:  NORVASC  Take 10 mg by mouth daily.     atenolol 25 MG tablet  Commonly known as:  TENORMIN  Take 25 mg by mouth daily.     cephALEXin 500 MG capsule  Commonly known as:  KEFLEX  Take 2 capsules (1,000 mg total) by mouth 2 (two) times daily. Take for 9 days then stop.  Start taking on:  02/20/2013     hydrochlorothiazide 25 MG tablet  Commonly known as:  HYDRODIURIL  Take 1 tablet (25 mg total) by mouth daily. Resume in 2-3 days.  Start taking on:  02/21/2013     lovastatin 20 MG tablet  Commonly known as:  MEVACOR  Take 20 mg by mouth daily.     oxyCODONE 5 MG immediate release tablet  Commonly known as:  Oxy IR/ROXICODONE  Take 1 tablet (5 mg total) by mouth every 4 (four) hours as needed for severe pain.       Allergies  Allergen Reactions  . Penicillins        Follow-up Information   Follow up with [facility]. (your appointment is scheduled for March 27, 2013 @ 11:00AM)    Contact information:   [address] [place] [address] [phone]       The results of significant diagnostics from this hospitalization (including imaging, microbiology, ancillary and laboratory) are listed below for reference.    Significant Diagnostic Studies: Ct Abdomen Pelvis W Contrast  02/17/2013   CLINICAL DATA:  Diarrhea and vomiting. Left leg swelling and erythema.  EXAM: CT ABDOMEN AND  PELVIS WITH CONTRAST  TECHNIQUE: Multidetector CT imaging of the abdomen and pelvis was performed using the standard protocol following bolus administration of intravenous contrast.  CONTRAST:  OMNIPAQUE IOHEXOL 300 MG/ML  SOLN  COMPARISON:  MRI of the lumbar spine performed 10/20/2010  FINDINGS: The visualized lung bases are clear.  The liver and spleen are unremarkable in appearance. The gallbladder is within normal limits. The pancreas and adrenal glands are unremarkable.  There is minimal left-sided hydronephrosis as described below. There is developmental atrophy of the right kidney, with minimal associated calcifications. The left kidney is otherwise unremarkable in appearance. No perinephric stranding is seen. No renal or ureteral stones are identified.  No free fluid is identified. The small bowel is unremarkable in appearance. The stomach is within normal limits. No acute vascular abnormalities are seen. Scattered periaortic and mesenteric nodes are borderline normal in size. Minimal calcification is noted along the abdominal aorta.  The appendix is normal in caliber, without evidence for appendicitis. The colon is unremarkable in appearance.  The bladder is mildly distended and grossly unremarkable. A small urachal remnant is incidentally seen. The prostate remains normal in size. A small to moderate left inguinal hernia is noted, containing only fat.  There is soft tissue inflammation about the course of the left femoral artery and vein, extending superiorly along the course of the left external iliac artery and vein, with trace associated free fluid. As there is no evidence of thrombus, this is most compatible with extension of left-sided phlebitis. Asymmetrically prominent left inguinal nodes are seen, measuring up to 1.4 cm in short axis, with mild associated soft tissue inflammation.  The soft tissue inflammation tracks about the course of the left ureter; though the left ureter remains normal  in caliber, there is associated minimal left-sided hydronephrosis.  No acute osseous abnormalities are identified.  IMPRESSION: 1. Soft tissue inflammation about the course of the left femoral artery and vein, extending superiorly along the left external iliac artery and vein, with trace associated free fluid. This is most compatible with extension of phlebitis from the left leg. No evidence of thrombosis at this time. 2. Left inguinal lymphadenopathy, measuring up to 1.4 cm in short axis, with mild associated soft tissue inflammation; this likely reflects the underlying phlebitis. 3. Soft tissue inflammation tracks about the course of the left ureter; though the left ureter remains normal in caliber, there is associated minimal left-sided hydronephrosis. No obstructing ureteral stones seen. 4. Developmental atrophy of the right kidney. 5. Small to moderate left inguinal hernia, containing only fat.   Electronically Signed   By: Roanna Raider M.D.   On: 02/17/2013 22:37    Microbiology: Recent Results (from the past 240 hour(s))  CLOSTRIDIUM DIFFICILE BY PCR     Status: None   Collection Time    02/18/13  4:27 AM      Result Value Range Status   C difficile by pcr NEGATIVE  NEGATIVE Final     Labs: Basic Metabolic Panel:  Recent Labs Lab 02/17/13 1734 02/18/13 0545 02/18/13 0845 02/19/13 0830  NA 134*  --  135 139  K 3.7  --  3.5 3.4*  CL 96  --  100 104  CO2 25  --  25 27  GLUCOSE 100*  --  113* 99  BUN 15  --  14 10  CREATININE 1.47* 1.46* 1.42* 1.26  CALCIUM 9.2  --  8.5 8.6   Liver Function Tests:  Recent Labs Lab 02/17/13 1734  AST 49*  ALT 38  ALKPHOS 71  BILITOT 1.5*  PROT 8.2  ALBUMIN 3.6   No results found for this basename: LIPASE, AMYLASE,  in the last 168 hours No results found for this basename: AMMONIA,  in the last 168 hours CBC:  Recent Labs Lab 02/17/13 1734 02/18/13 0545 02/19/13 0830  WBC 17.9* 14.6* 7.7  NEUTROABS 15.1* 12.2*  --   HGB 15.1  13.2 12.6*  HCT 43.6 37.7* 36.4*  MCV 91.8 90.8 90.3  PLT 184 157 159   Cardiac Enzymes: No results found for this basename: CKTOTAL, CKMB, CKMBINDEX, TROPONINI,  in the last 168 hours BNP: BNP (last 3 results) No results found for this basename: PROBNP,  in the last 8760 hours CBG: No results found for this basename: GLUCAP,  in the last 168 hours     Signed:  [facility] MD [facility] 02/19/2013, 11:33 AM

## 2013-02-22 LAB — STOOL CULTURE

## 2013-03-27 ENCOUNTER — Ambulatory Visit: Payer: Self-pay | Attending: Internal Medicine | Admitting: Internal Medicine

## 2013-03-27 ENCOUNTER — Encounter: Payer: Self-pay | Admitting: Internal Medicine

## 2013-03-27 VITALS — BP 122/80 | HR 65 | Temp 98.1°F | Resp 16 | Ht 68.0 in | Wt 207.0 lb

## 2013-03-27 DIAGNOSIS — I890 Lymphedema, not elsewhere classified: Secondary | ICD-10-CM | POA: Insufficient documentation

## 2013-03-27 DIAGNOSIS — I10 Essential (primary) hypertension: Secondary | ICD-10-CM

## 2013-03-27 DIAGNOSIS — L0390 Cellulitis, unspecified: Secondary | ICD-10-CM

## 2013-03-27 DIAGNOSIS — E785 Hyperlipidemia, unspecified: Secondary | ICD-10-CM

## 2013-03-27 DIAGNOSIS — L02419 Cutaneous abscess of limb, unspecified: Secondary | ICD-10-CM | POA: Insufficient documentation

## 2013-03-27 DIAGNOSIS — L03119 Cellulitis of unspecified part of limb: Principal | ICD-10-CM

## 2013-03-27 DIAGNOSIS — Z139 Encounter for screening, unspecified: Secondary | ICD-10-CM

## 2013-03-27 DIAGNOSIS — L0291 Cutaneous abscess, unspecified: Secondary | ICD-10-CM

## 2013-03-27 NOTE — Progress Notes (Signed)
Pt here to establish care s/p hospital d/c 12/22-12/24 left leg staph infection secondary Lymphedema Denies oozing or pain  Taking daily medication for HTN Compressions left leg noted

## 2013-03-27 NOTE — Progress Notes (Signed)
Patient Demographics  Brian Wade, is a 48 y.o. male  ZOX:[id number]  WJX:[id number]  DOB - 02/17/1966  CC:  Chief Complaint  Patient presents with  . Establish Care  . Hypertension  . Hyperlipidemia       HPI: Brian MangoJames Burlison is a 48 y.o. male here today to establish medical care. , patient has history of hypertension hyperlipidemia chronic lymphedema of left lower extremity, he was recently hospitalized with nausea vomiting probably gastroenteritis  as well as left leg cellulitis was treated with IV antibiotics and then patient was discharged on oral antibiotic Keflex which as per patient already completed, EMR reviewed patient also had acute renal insufficiency was treated with IV fluids which improved, patient has been taking Norvasc and atenolol for blood pressure as well as hydrochlorothiazide, patient denies any redness fever chills or any oozing, has baseline chronic lymphedema as per patient he used to follow with his vascular doctor currently wearing compression stockings, patient denies any headache dizziness chest pain shortness of breath. Patient has No headache, No chest pain, No abdominal pain - No Nausea, No new weakness tingling or numbness, No Cough - SOB.  Allergies  Allergen Reactions  . Penicillins    Past Medical History  Diagnosis Date  . Lymphedema    Current Outpatient Prescriptions on File Prior to Visit  Medication Sig Dispense Refill  . amLODipine (NORVASC) 10 MG tablet Take 10 mg by mouth daily.      Marland Kitchen. atenolol (TENORMIN) 25 MG tablet Take 25 mg by mouth daily.      . hydrochlorothiazide (HYDRODIURIL) 25 MG tablet Take 1 tablet (25 mg total) by mouth daily. Resume in 2-3 days.      Marland Kitchen. lovastatin (MEVACOR) 20 MG tablet Take 20 mg by mouth daily.      Marland Kitchen. oxyCODONE (OXY IR/ROXICODONE) 5 MG immediate release tablet Take 1 tablet (5 mg total) by mouth every 4 (four) hours as needed for severe pain.  15 tablet  0   No current facility-administered medications on  file prior to visit.   Family History  Problem Relation Age of Onset  . Asthma Sister   . Heart disease Mother   . Hypertension Mother   . Diabetes Maternal Aunt   . Cancer Maternal Grandmother    History   Social History  . Marital Status: Single    Spouse Name: N/A    Number of Children: N/A  . Years of Education: N/A   Occupational History  . Not on file.   Social History Main Topics  . Smoking status: Never Smoker   . Smokeless tobacco: Not on file  . Alcohol Use: No  . Drug Use: No  . Sexual Activity: Not on file   Other Topics Concern  . Not on file   Social History Narrative  . No narrative on file    Review of Systems: Constitutional: Negative for fever, chills, diaphoresis, activity change, appetite change and fatigue. HENT: Negative for ear pain, nosebleeds, congestion, facial swelling, rhinorrhea, neck pain, neck stiffness and ear discharge.  Eyes: Negative for pain, discharge, redness, itching and visual disturbance. Respiratory: Negative for cough, choking, chest tightness, shortness of breath, wheezing and stridor.  Cardiovascular: Negative for chest pain, palpitations and left leg chronic lymphedema Gastrointestinal: Negative for abdominal distention. Genitourinary: Negative for dysuria, urgency, frequency, hematuria, flank pain, decreased urine volume, difficulty urinating and dyspareunia.  Musculoskeletal: Negative for back pain, joint swelling, arthralgia and gait problem. Neurological: Negative for dizziness, tremors, seizures, syncope, facial asymmetry,  speech difficulty, weakness, light-headedness, numbness and headaches.  Hematological: Negative for adenopathy. Does not bruise/bleed easily. Psychiatric/Behavioral: Negative for hallucinations, behavioral problems, confusion, dysphoric mood, decreased concentration and agitation.    Objective:   Filed Vitals:   03/27/13 1048  BP: 122/80  Pulse: 65  Temp: 98.1 F (36.7 C)  Resp: 16     Physical Exam: Constitutional: Patient appears well-developed and well-nourished. No distress. HENT: Normocephalic, atraumatic, External right and left ear normal. Oropharynx is clear and moist.  Eyes: Conjunctivae and EOM are normal. PERRLA, no scleral icterus. Neck: Normal ROM. Neck supple. No JVD. No tracheal deviation. No thyromegaly. CVS: RRR, S1/S2 +, no murmurs, no gallops, no carotid bruit.  Pulmonary: Effort and breath sounds normal, no stridor, rhonchi, wheezes, rales.  Abdominal: Soft. BS +, no distension, tenderness, rebound or guarding.  Musculoskeletal: Left leg chronic lymphedema no erythema no tenderness.  Neuro: Alert. Normal reflexes, muscle tone coordination. No cranial nerve deficit. Psychiatric: Normal mood and affect. Behavior, judgment, thought content normal.  Lab Results  Component Value Date   WBC 7.7 02/19/2013   HGB 12.6* 02/19/2013   HCT 36.4* 02/19/2013   MCV 90.3 02/19/2013   PLT 159 02/19/2013   Lab Results  Component Value Date   CREATININE 1.26 02/19/2013   BUN 10 02/19/2013   NA 139 02/19/2013   K 3.4* 02/19/2013   CL 104 02/19/2013   CO2 27 02/19/2013    No results found for this basename: HGBA1C   Lipid Panel  No results found for this basename: chol, trig, hdl, cholhdl, vldl, ldlcalc       Assessment and plan:   1. Cellulitis  Currently no sign and symptoms of cellulitis patient already finished a course of antibiotic.  2. HTN (hypertension)  - COMPLETE METABOLIC PANEL WITH GFR; Future  3. HLD (hyperlipidemia)  - Lipid panel; Future  4. Screening  - CBC with Differential; Future - TSH; Future - Vit D  25 hydroxy (rtn osteoporosis monitoring); Future  Chronic lymphedema Stable continue with compression stockings follow with vascular.  Patient has already eaten today we'll do blood work prior to the next visit.     Return in about 6 weeks (around 05/08/2013).    Doris Cheadle, MD

## 2013-05-20 ENCOUNTER — Ambulatory Visit: Payer: Self-pay | Attending: Internal Medicine

## 2013-05-20 DIAGNOSIS — E785 Hyperlipidemia, unspecified: Secondary | ICD-10-CM

## 2013-05-20 DIAGNOSIS — Z139 Encounter for screening, unspecified: Secondary | ICD-10-CM

## 2013-05-20 DIAGNOSIS — I10 Essential (primary) hypertension: Secondary | ICD-10-CM

## 2013-05-20 LAB — COMPLETE METABOLIC PANEL WITH GFR
ALT: 25 U/L (ref 0–53)
AST: 24 U/L (ref 0–37)
Albumin: 4.2 g/dL (ref 3.5–5.2)
Alkaline Phosphatase: 58 U/L (ref 39–117)
BILIRUBIN TOTAL: 0.5 mg/dL (ref 0.2–1.2)
BUN: 12 mg/dL (ref 6–23)
CO2: 30 mEq/L (ref 19–32)
Calcium: 9.7 mg/dL (ref 8.4–10.5)
Chloride: 101 mEq/L (ref 96–112)
Creat: 1.15 mg/dL (ref 0.50–1.35)
GFR, Est African American: 86 mL/min
GFR, Est Non African American: 74 mL/min
Glucose, Bld: 100 mg/dL — ABNORMAL HIGH (ref 70–99)
Potassium: 3.7 mEq/L (ref 3.5–5.3)
Sodium: 141 mEq/L (ref 135–145)
Total Protein: 7.9 g/dL (ref 6.0–8.3)

## 2013-05-20 LAB — CBC WITH DIFFERENTIAL/PLATELET
Basophils Absolute: 0.1 10*3/uL (ref 0.0–0.1)
Basophils Relative: 1 % (ref 0–1)
Eosinophils Absolute: 0.3 10*3/uL (ref 0.0–0.7)
Eosinophils Relative: 5 % (ref 0–5)
HCT: 41 % (ref 39.0–52.0)
Hemoglobin: 14.2 g/dL (ref 13.0–17.0)
LYMPHS ABS: 3.7 10*3/uL (ref 0.7–4.0)
Lymphocytes Relative: 54 % — ABNORMAL HIGH (ref 12–46)
MCH: 31.4 pg (ref 26.0–34.0)
MCHC: 34.6 g/dL (ref 30.0–36.0)
MCV: 90.7 fL (ref 78.0–100.0)
MONOS PCT: 7 % (ref 3–12)
Monocytes Absolute: 0.5 10*3/uL (ref 0.1–1.0)
Neutro Abs: 2.2 10*3/uL (ref 1.7–7.7)
Neutrophils Relative %: 33 % — ABNORMAL LOW (ref 43–77)
Platelets: 260 10*3/uL (ref 150–400)
RBC: 4.52 MIL/uL (ref 4.22–5.81)
RDW: 13.7 % (ref 11.5–15.5)
WBC: 6.8 10*3/uL (ref 4.0–10.5)

## 2013-05-20 LAB — LIPID PANEL
CHOL/HDL RATIO: 5 ratio
CHOLESTEROL: 166 mg/dL (ref 0–200)
HDL: 33 mg/dL — ABNORMAL LOW (ref >39)
LDL CALC: 101 mg/dL — AB (ref 0–99)
Triglycerides: 159 mg/dL — ABNORMAL HIGH (ref <150)
VLDL: 32 mg/dL (ref 0–40)

## 2013-05-21 ENCOUNTER — Telehealth: Payer: Self-pay

## 2013-05-21 LAB — TSH: TSH: 3.62 u[IU]/mL (ref 0.350–4.500)

## 2013-05-21 LAB — VITAMIN D 25 HYDROXY (VIT D DEFICIENCY, FRACTURES): Vit D, 25-Hydroxy: 15 ng/mL — ABNORMAL LOW (ref 30–89)

## 2013-05-21 MED ORDER — VITAMIN D (ERGOCALCIFEROL) 1.25 MG (50000 UNIT) PO CAPS: 50000.0000 [IU] | ORAL_CAPSULE | ORAL | Status: DC

## 2013-05-21 NOTE — Telephone Encounter (Signed)
Patient is aware of his lab results Prescription sent to [facility]

## 2013-05-21 NOTE — Telephone Encounter (Signed)
Message copied by Lestine MountJUAREZ, Oluwatomisin Deman L on Wed May 21, 2013 12:30 PM ------      Message from: Doris CheadleADVANI, DEEPAK      Created: Wed May 21, 2013 12:12 PM       Blood work reviewed, noticed low vitamin D, call patient advise to start ergocalciferol 50,000 units once a week for the duration of  12 weeks.      Blood work reviewed, noticed elevated triglycerides, advise patient for low fat diet.        ------

## 2013-05-23 ENCOUNTER — Ambulatory Visit: Payer: Self-pay | Admitting: Internal Medicine

## 2013-06-23 ENCOUNTER — Telehealth: Payer: Self-pay

## 2013-06-23 ENCOUNTER — Telehealth: Payer: Self-pay | Admitting: Internal Medicine

## 2013-06-23 MED ORDER — HYDROCHLOROTHIAZIDE 25 MG PO TABS: 25.0000 mg | ORAL_TABLET | Freq: Every day | ORAL | Status: DC

## 2013-06-23 MED ORDER — AMLODIPINE BESYLATE 10 MG PO TABS: 10.0000 mg | ORAL_TABLET | Freq: Every day | ORAL | Status: DC

## 2013-06-23 MED ORDER — LOVASTATIN 20 MG PO TABS: 20.0000 mg | ORAL_TABLET | Freq: Every day | ORAL | Status: DC

## 2013-06-23 MED ORDER — ATENOLOL 25 MG PO TABS: 25.0000 mg | ORAL_TABLET | Freq: Every day | ORAL | Status: DC

## 2013-06-23 NOTE — Telephone Encounter (Signed)
Pt came in today to request refills on amLODipine (NORVASC) 10 MG tablet, lovastatin (MEVACOR) 20 MG tablet, atenolol (TENORMIN) 25 MG tablet & hydrochlorothiazide (HYDRODIURIL) 25 MG tablet; Please f/u with patient about his refills; pt [facility] from this point forward;

## 2013-06-23 NOTE — Telephone Encounter (Signed)
Left message for patient that we refilled his prescriptions [facility]

## 2013-07-04 ENCOUNTER — Ambulatory Visit: Payer: Self-pay | Attending: Internal Medicine | Admitting: Internal Medicine

## 2013-07-04 ENCOUNTER — Encounter: Payer: Self-pay | Admitting: Internal Medicine

## 2013-07-04 VITALS — BP 130/84 | HR 76 | Temp 98.5°F | Resp 16

## 2013-07-04 DIAGNOSIS — R7301 Impaired fasting glucose: Secondary | ICD-10-CM | POA: Insufficient documentation

## 2013-07-04 DIAGNOSIS — I10 Essential (primary) hypertension: Secondary | ICD-10-CM

## 2013-07-04 DIAGNOSIS — E785 Hyperlipidemia, unspecified: Secondary | ICD-10-CM

## 2013-07-04 DIAGNOSIS — E559 Vitamin D deficiency, unspecified: Secondary | ICD-10-CM

## 2013-07-04 MED ORDER — ATENOLOL 50 MG PO TABS: 50.0000 mg | ORAL_TABLET | Freq: Every day | ORAL | Status: DC

## 2013-07-04 MED ORDER — HYDROCHLOROTHIAZIDE 25 MG PO TABS: 25.0000 mg | ORAL_TABLET | Freq: Every day | ORAL | Status: DC

## 2013-07-04 MED ORDER — AMLODIPINE BESYLATE 10 MG PO TABS: 10.0000 mg | ORAL_TABLET | Freq: Every day | ORAL | Status: DC

## 2013-07-04 MED ORDER — ATENOLOL 50 MG PO TABS
50.0000 mg | ORAL_TABLET | Freq: Every day | ORAL | Status: DC
Start: 2013-07-04 — End: 2013-12-09

## 2013-07-04 NOTE — Progress Notes (Signed)
Patient here for follow up on his HTN Needs medication refill

## 2013-07-04 NOTE — Patient Instructions (Signed)
DASH Diet  The DASH diet stands for "Dietary Approaches to Stop Hypertension." It is a healthy eating plan that has been shown to reduce high blood pressure (hypertension) in as little as 14 days, while also possibly providing other significant health benefits. These other health benefits include reducing the risk of breast cancer after menopause and reducing the risk of type 2 diabetes, heart disease, colon cancer, and stroke. Health benefits also include weight loss and slowing kidney failure in patients with chronic kidney disease.   DIET GUIDELINES  · Limit salt (sodium). Your diet should contain less than 1500 mg of sodium daily.  · Limit refined or processed carbohydrates. Your diet should include mostly whole grains. Desserts and added sugars should be used sparingly.  · Include small amounts of heart-healthy fats. These types of fats include nuts, oils, and tub margarine. Limit saturated and trans fats. These fats have been shown to be harmful in the body.  CHOOSING FOODS   The following food groups are based on a 2000 calorie diet. See your Registered Dietitian for individual calorie needs.  Grains and Grain Products (6 to 8 servings daily)  · Eat More Often: Whole-wheat bread, brown rice, whole-grain or wheat pasta, quinoa, popcorn without added fat or salt (air popped).  · Eat Less Often: White bread, white pasta, white rice, cornbread.  Vegetables (4 to 5 servings daily)  · Eat More Often: Fresh, frozen, and canned vegetables. Vegetables may be raw, steamed, roasted, or grilled with a minimal amount of fat.  · Eat Less Often/Avoid: Creamed or fried vegetables. Vegetables in a cheese sauce.  Fruit (4 to 5 servings daily)  · Eat More Often: All fresh, canned (in natural juice), or frozen fruits. Dried fruits without added sugar. One hundred percent fruit juice (½ cup [237 mL] daily).  · Eat Less Often: Dried fruits with added sugar. Canned fruit in light or heavy syrup.  Lean Meats, Fish, and Poultry (2  servings or less daily. One serving is 3 to 4 oz [85-114 g]).  · Eat More Often: Ninety percent or leaner ground beef, tenderloin, sirloin. Round cuts of beef, chicken breast, turkey breast. All fish. Grill, bake, or broil your meat. Nothing should be fried.  · Eat Less Often/Avoid: Fatty cuts of meat, turkey, or chicken leg, thigh, or wing. Fried cuts of meat or fish.  Dairy (2 to 3 servings)  · Eat More Often: Low-fat or fat-free milk, low-fat plain or light yogurt, reduced-fat or part-skim cheese.  · Eat Less Often/Avoid: Milk (whole, 2%). Whole milk yogurt. Full-fat cheeses.  Nuts, Seeds, and Legumes (4 to 5 servings per week)  · Eat More Often: All without added salt.  · Eat Less Often/Avoid: Salted nuts and seeds, canned beans with added salt.  Fats and Sweets (limited)  · Eat More Often: Vegetable oils, tub margarines without trans fats, sugar-free gelatin. Mayonnaise and salad dressings.  · Eat Less Often/Avoid: Coconut oils, palm oils, butter, stick margarine, cream, half and half, cookies, candy, pie.  FOR MORE INFORMATION  The Dash Diet Eating Plan: www.dashdiet.org  Document Released: 02/02/2011 Document Revised: 05/08/2011 Document Reviewed: 02/02/2011  ExitCare® Patient Information ©2014 [employer].

## 2013-07-04 NOTE — Progress Notes (Signed)
MRN: [id number] Name: Brian Wade  Sex: male Age: 48 y.o. DOB: 01/16/1966  Allergies: Penicillins  Chief Complaint  Patient presents with  . Medication Refill    HPI: Patient is 48 y.o. male who has history of hypertension hyperlipidemia, patient had a blood work done which was reviewed with the patient noticed vitamin D deficiency, patient has already been started on supplement , she's requesting refill on medications, denies any acute symptoms denies any headache dizziness chest and shortness of breath.  Past Medical History  Diagnosis Date  . Lymphedema     Past Surgical History  Procedure Laterality Date  . No past surgeries        Medication List       This list is accurate as of: 07/04/13 12:16 PM.  Always use your most recent med list.               amLODipine 10 MG tablet  Commonly known as:  NORVASC  Take 1 tablet (10 mg total) by mouth daily.     atenolol 50 MG tablet  Commonly known as:  TENORMIN  Take 1 tablet (50 mg total) by mouth daily.     hydrochlorothiazide 25 MG tablet  Commonly known as:  HYDRODIURIL  Take 1 tablet (25 mg total) by mouth daily. Resume in 2-3 days.     lovastatin 20 MG tablet  Commonly known as:  MEVACOR  Take 1 tablet (20 mg total) by mouth daily.     oxyCODONE 5 MG immediate release tablet  Commonly known as:  Oxy IR/ROXICODONE  Take 1 tablet (5 mg total) by mouth every 4 (four) hours as needed for severe pain.     Vitamin D (Ergocalciferol) 50000 UNITS Caps capsule  Commonly known as:  DRISDOL  Take 1 capsule (50,000 Units total) by mouth every 7 (seven) days.        Meds ordered this encounter  Medications  . DISCONTD: atenolol (TENORMIN) 50 MG tablet    Sig: Take 1 tablet (50 mg total) by mouth daily.    Dispense:  30 tablet    Refill:  0  . atenolol (TENORMIN) 50 MG tablet    Sig: Take 1 tablet (50 mg total) by mouth daily.    Dispense:  30 tablet    Refill:  6  . amLODipine (NORVASC) 10 MG tablet   Sig: Take 1 tablet (10 mg total) by mouth daily.    Dispense:  30 tablet    Refill:  6  . hydrochlorothiazide (HYDRODIURIL) 25 MG tablet    Sig: Take 1 tablet (25 mg total) by mouth daily. Resume in 2-3 days.    Dispense:  30 tablet    Refill:  6     There is no immunization history on file for this patient.  Family History  Problem Relation Age of Onset  . Asthma Sister   . Heart disease Mother   . Hypertension Mother   . Diabetes Maternal Aunt   . Cancer Maternal Grandmother     History  Substance Use Topics  . Smoking status: Never Smoker   . Smokeless tobacco: Not on file  . Alcohol Use: No    Review of Systems   As noted in HPI  Filed Vitals:   07/04/13 1141  BP: 130/84  Pulse: 76  Temp: 98.5 F (36.9 C)  Resp: 16    Physical Exam  Physical Exam  Constitutional: He is oriented to person, place, and time. No distress.  Eyes: EOM are normal. Pupils are equal, round, and reactive to light.  Cardiovascular: Normal rate and regular rhythm.   Pulmonary/Chest: Breath sounds normal. No respiratory distress. He has no wheezes. He has no rales.  Neurological: He is alert and oriented to person, place, and time.    CBC    Component Value Date/Time   WBC 6.8 05/20/2013 0912   RBC 4.52 05/20/2013 0912   HGB 14.2 05/20/2013 0912   HCT 41.0 05/20/2013 0912   PLT 260 05/20/2013 0912   MCV 90.7 05/20/2013 0912   LYMPHSABS 3.7 05/20/2013 0912   MONOABS 0.5 05/20/2013 0912   EOSABS 0.3 05/20/2013 0912   BASOSABS 0.1 05/20/2013 0912    CMP     Component Value Date/Time   NA 141 05/20/2013 0912   K 3.7 05/20/2013 0912   CL 101 05/20/2013 0912   CO2 30 05/20/2013 0912   GLUCOSE 100* 05/20/2013 0912   BUN 12 05/20/2013 0912   CREATININE 1.15 05/20/2013 0912   CREATININE 1.26 02/19/2013 0830   CALCIUM 9.7 05/20/2013 0912   PROT 7.9 05/20/2013 0912   ALBUMIN 4.2 05/20/2013 0912   AST 24 05/20/2013 0912   ALT 25 05/20/2013 0912   ALKPHOS 58 05/20/2013 0912   BILITOT 0.5  05/20/2013 0912   GFRNONAA 74 05/20/2013 0912   GFRNONAA 65* 02/19/2013 0830   GFRAA 86 05/20/2013 0912   GFRAA 76* 02/19/2013 0830    Lab Results  Component Value Date/Time   CHOL 166 05/20/2013  9:12 AM    No components found with this basename: hga1c    Lab Results  Component Value Date/Time   AST 24 05/20/2013  9:12 AM    Assessment and Plan  HTN (hypertension) - Plan: Controlled continue with atenolol (TENORMIN) 50 MG tablet, amLODipine (NORVASC) 10 MG tablet, hydrochlorothiazide (HYDRODIURIL) 25 MG tablet, will repeat blood chemistry on the next visit  HLD (hyperlipidemia) Continue the statins will repeat lipid panel on the next visit.  Unspecified vitamin D deficiency Continue with vitamin D supplement  IFG (impaired fasting glucose) Advised patient for low carbohydrate diet   Return in about 6 months (around 01/04/2014).  Doris Cheadleeepak Deray Dawes, MD

## 2013-07-23 ENCOUNTER — Other Ambulatory Visit: Payer: Self-pay | Admitting: Internal Medicine

## 2013-07-24 ENCOUNTER — Other Ambulatory Visit: Payer: Self-pay | Admitting: Internal Medicine

## 2013-07-24 DIAGNOSIS — E785 Hyperlipidemia, unspecified: Secondary | ICD-10-CM

## 2013-08-21 ENCOUNTER — Other Ambulatory Visit: Payer: Self-pay | Admitting: Internal Medicine

## 2013-09-17 ENCOUNTER — Other Ambulatory Visit: Payer: Self-pay | Admitting: Internal Medicine

## 2013-09-17 DIAGNOSIS — E785 Hyperlipidemia, unspecified: Secondary | ICD-10-CM

## 2013-10-06 ENCOUNTER — Other Ambulatory Visit: Payer: Self-pay | Admitting: Internal Medicine

## 2013-10-06 DIAGNOSIS — I10 Essential (primary) hypertension: Secondary | ICD-10-CM

## 2013-10-20 ENCOUNTER — Other Ambulatory Visit: Payer: Self-pay | Admitting: Internal Medicine

## 2013-10-24 ENCOUNTER — Other Ambulatory Visit: Payer: Self-pay | Admitting: *Deleted

## 2013-10-24 DIAGNOSIS — E785 Hyperlipidemia, unspecified: Secondary | ICD-10-CM

## 2013-10-24 MED ORDER — SIMVASTATIN 10 MG PO TABS: ORAL_TABLET | ORAL | Status: DC

## 2013-11-21 ENCOUNTER — Other Ambulatory Visit: Payer: Self-pay | Admitting: Internal Medicine

## 2013-12-09 ENCOUNTER — Ambulatory Visit: Payer: Self-pay | Attending: Family Medicine | Admitting: Family Medicine

## 2013-12-09 ENCOUNTER — Encounter: Payer: Self-pay | Admitting: Family Medicine

## 2013-12-09 VITALS — BP 127/81 | HR 63 | Temp 97.7°F | Resp 18 | Ht 68.5 in | Wt 208.0 lb

## 2013-12-09 DIAGNOSIS — E785 Hyperlipidemia, unspecified: Secondary | ICD-10-CM | POA: Insufficient documentation

## 2013-12-09 DIAGNOSIS — Z760 Encounter for issue of repeat prescription: Secondary | ICD-10-CM | POA: Insufficient documentation

## 2013-12-09 DIAGNOSIS — I10 Essential (primary) hypertension: Secondary | ICD-10-CM | POA: Insufficient documentation

## 2013-12-09 LAB — LDL CHOLESTEROL, DIRECT: Direct LDL: 129 mg/dL — ABNORMAL HIGH

## 2013-12-09 MED ORDER — SIMVASTATIN 10 MG PO TABS: ORAL_TABLET | ORAL | Status: DC

## 2013-12-09 MED ORDER — ATENOLOL 50 MG PO TABS: 50.0000 mg | ORAL_TABLET | Freq: Every day | ORAL | Status: DC

## 2013-12-09 MED ORDER — AMLODIPINE BESYLATE 10 MG PO TABS: 10.0000 mg | ORAL_TABLET | Freq: Every day | ORAL | Status: DC

## 2013-12-09 MED ORDER — HYDROCHLOROTHIAZIDE 25 MG PO TABS: ORAL_TABLET | ORAL | Status: DC

## 2013-12-09 NOTE — Patient Instructions (Signed)
[name],  Very nice to meet you today. Excellent blood pressure!  I have refilled your simvastatin and BP meds, 90 supply with 2 refills on all.  Continue to work on diet and exercise. Walking is very effective exercise.   I recommend a f/u with Dr. Orpah CobbAdvani in 6 months.   Dr. Armen PickupFunches

## 2013-12-09 NOTE — Assessment & Plan Note (Signed)
A: Blood pressure at goal. Meds: compliant  P: refilled BP meds, 90 day supply with 2 refills F/u with PCP in 6 months

## 2013-12-09 NOTE — Assessment & Plan Note (Signed)
A: tolerating statin, ran out. P: Refilled medication, 90 day supply with 2 refills dLDL today

## 2013-12-09 NOTE — Progress Notes (Signed)
   Subjective:    Patient ID: Adline MangoJames Husser, male    DOB: 12/10/1965, 48 y.o.   MRN: [id number] CC: medication refill PCP: Dr. Orpah CobbAdvani  HPI 1. HLD: out of simvastatin x 7 days. Tolerating medication. No muscle aches or pains. Out of work, so gained some weight. Exercising by walking.   2. HTN: compliant with all meds. No CP, SOB, LE edema.   3. HM: refused flu shot   Soc hx: non smoker  Review of Systems As per HPI     Objective:   Physical Exam BP 127/81  Pulse 63  Temp(Src) 97.7 F (36.5 C) (Oral)  Resp 18  Ht 5' 8.5" (1.74 m)  Wt 208 lb (94.348 kg)  BMI 31.16 kg/m2  SpO2 99% General appearance: alert, cooperative and no distress Lungs: clear to auscultation bilaterally Heart: regular rate and rhythm, S1, S2 normal, no murmur, click, rub or gallop        Assessment & Plan:

## 2013-12-09 NOTE — Progress Notes (Signed)
F/U HTN  Medicine refill Simvastatin  10 mg

## 2013-12-10 MED ORDER — SIMVASTATIN 20 MG PO TABS: 20.0000 mg | ORAL_TABLET | Freq: Every day | ORAL | Status: DC

## 2013-12-10 NOTE — Addendum Note (Signed)
Addended by: [name], Laiken Nohr on: 12/10/2013 01:46 PM   Modules accepted: Orders

## 2013-12-12 ENCOUNTER — Other Ambulatory Visit: Payer: Self-pay

## 2013-12-17 ENCOUNTER — Telehealth: Payer: Self-pay | Admitting: *Deleted

## 2013-12-17 NOTE — Telephone Encounter (Signed)
Left voice message to return call 

## 2013-12-17 NOTE — Telephone Encounter (Signed)
Message copied by Dyann KiefGIRALDEZ, Maalle Starrett M on Wed Dec 17, 2013 12:00 PM ------      Message from: [name], [name]      Created: Wed Dec 10, 2013  1:44 PM       LDL a bit higher than last check,      Recommend increase simvastatin to 20 mg daily . Take two of the 10 mg tablets until you run out. New 20 mg Rx sent to pharmacy. ------

## 2013-12-26 ENCOUNTER — Ambulatory Visit: Payer: Self-pay

## 2014-01-16 ENCOUNTER — Ambulatory Visit: Payer: Self-pay

## 2014-08-03 IMAGING — CT CT ABD-PELV W/ CM
2 of 5 series · 15 of 46 positions shown, 17 images · IV contrast (APPLIED)
Comparison: MRI of the lumbar spine performed 10/20/2010

CLINICAL DATA: Diarrhea and vomiting. Left leg swelling and
erythema.

EXAM:
CT ABDOMEN AND PELVIS WITH CONTRAST
TECHNIQUE: Multidetector CT imaging of the abdomen and pelvis was performed
using the standard protocol following bolus administration of
intravenous contrast.
CONTRAST:  100mL OMNIPAQUE IOHEXOL 300 MG/ML  SOLN

[Series 2: abd/ pelvis 5.0 i30f 1 · axial · 0.83mm/px · z∈[-480,-25]mm · 12 of 103 slices shown, 14 images]
[im 6/103  soft-tissue]
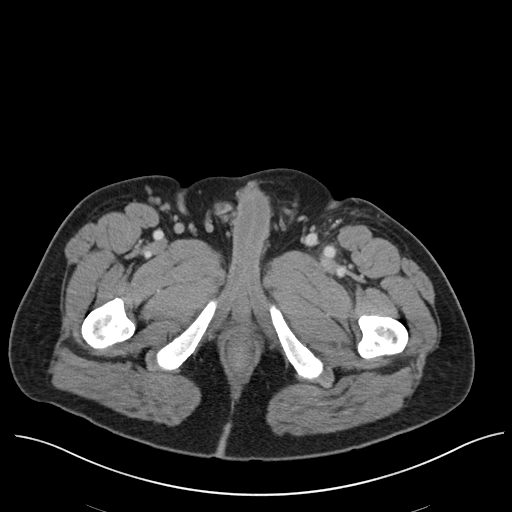
[im 6/103  bone]
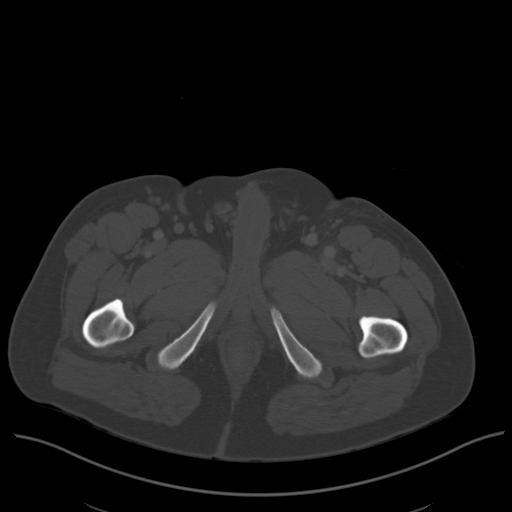
[im 18/103  soft-tissue]
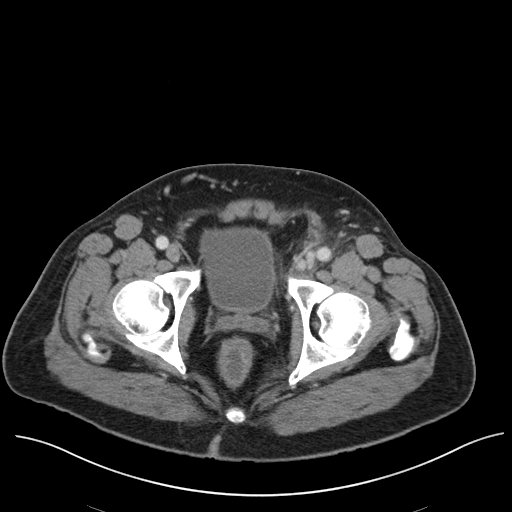
[im 23/103  soft-tissue]
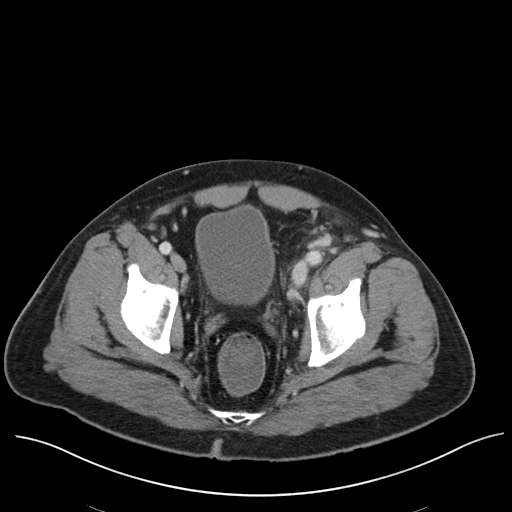
[im 29/103  soft-tissue]
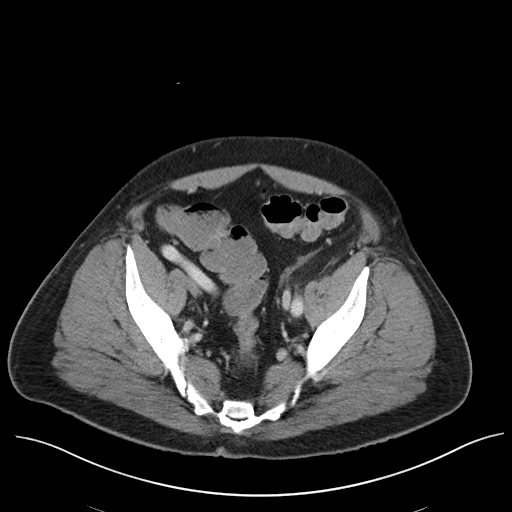
[im 40/103  soft-tissue]
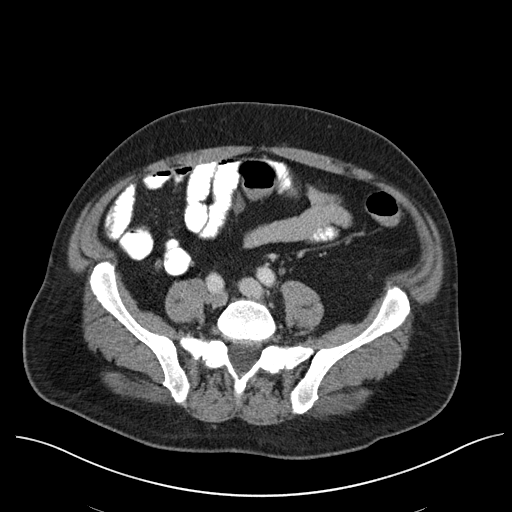
[im 46/103  soft-tissue]
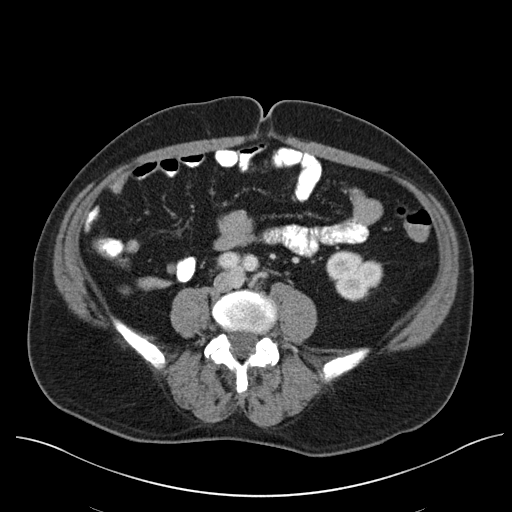
[im 57/103  soft-tissue]
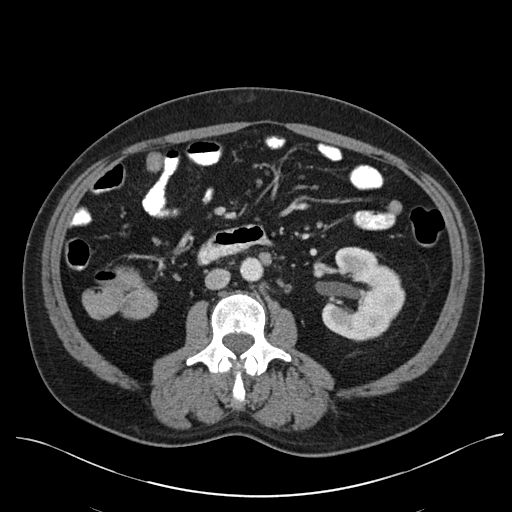
[im 63/103  soft-tissue]
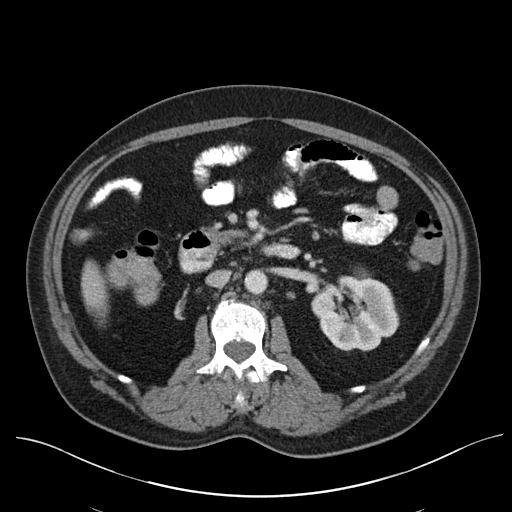
[im 74/103  soft-tissue]
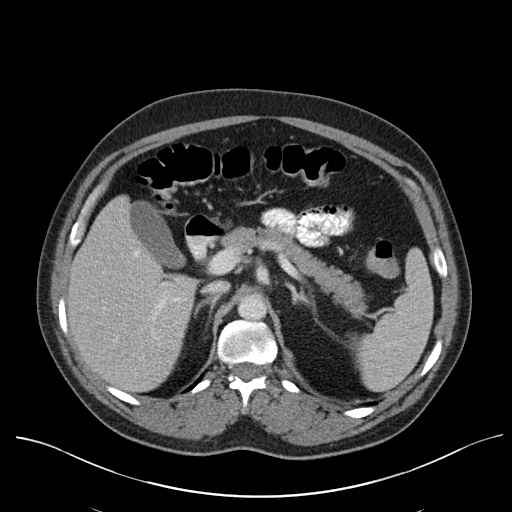
[im 74/103  bone]
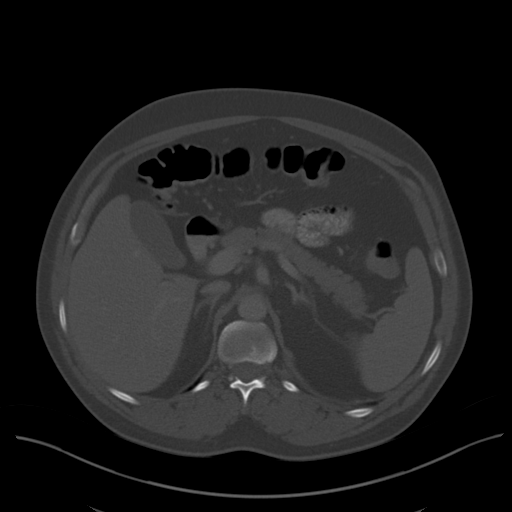
[im 80/103  soft-tissue]
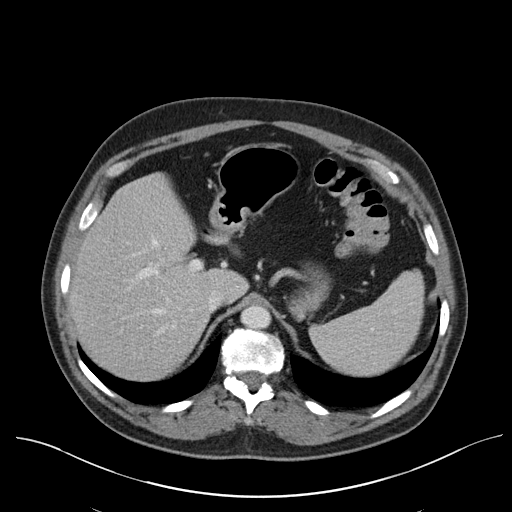
[im 86/103  soft-tissue]
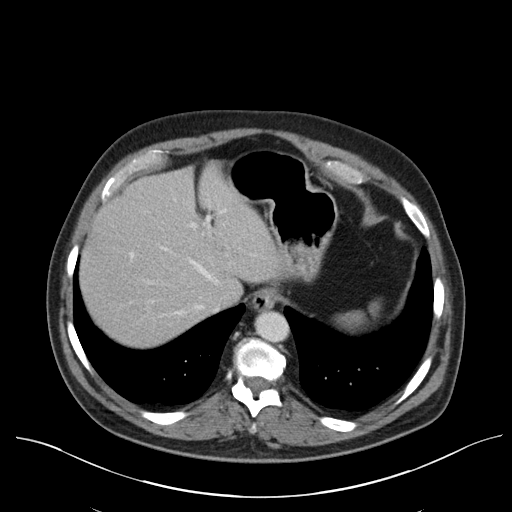
[im 97/103  soft-tissue]
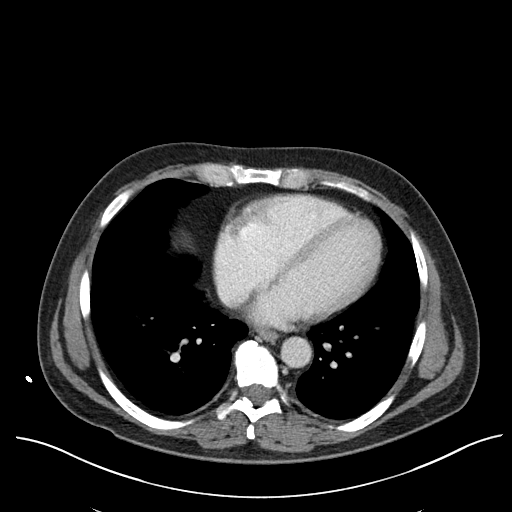

[Series 5: cor · coronal · 0.81mm/px · 3 of 148 slices shown]
[im 50/148  soft-tissue]
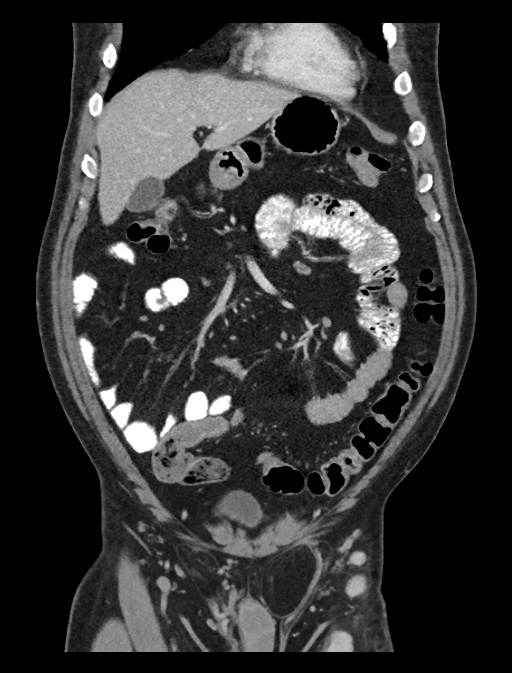
[im 66/148  soft-tissue]
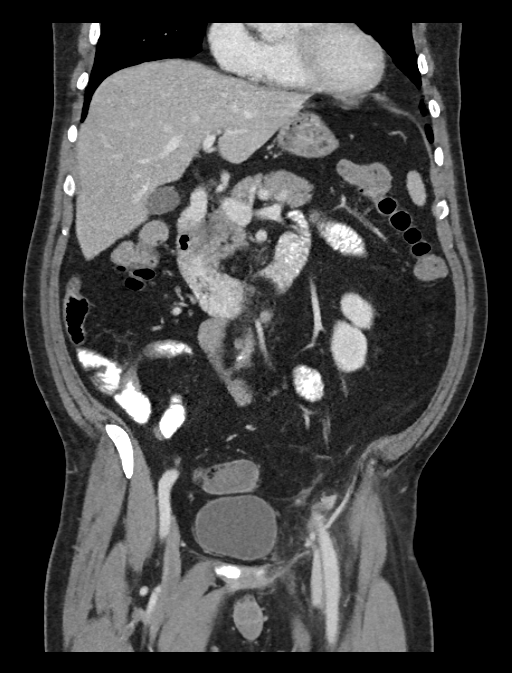
[im 82/148  soft-tissue]
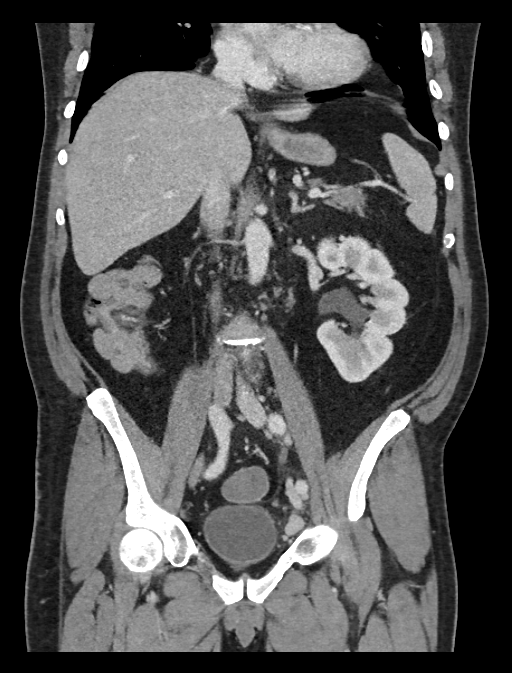

[15 of 46 positions shown; findings below may reference images not displayed]

FINDINGS: The visualized lung bases are clear.

The liver and spleen are unremarkable in appearance. The gallbladder
is within normal limits. The pancreas and adrenal glands are
unremarkable.

There is minimal left-sided hydronephrosis as described below. There
is developmental atrophy of the right kidney, with minimal
associated calcifications. The left kidney is otherwise unremarkable
in appearance. No perinephric stranding is seen. No renal or
ureteral stones are identified.

No free fluid is identified. The small bowel is unremarkable in
appearance. The stomach is within normal limits. No acute vascular
abnormalities are seen. Scattered periaortic and mesenteric nodes
are borderline normal in size. Minimal calcification is noted along
the abdominal aorta.

The appendix is normal in caliber, without evidence for
appendicitis. The colon is unremarkable in appearance.

The bladder is mildly distended and grossly unremarkable. A small
urachal remnant is incidentally seen. The prostate remains normal in
size. A small to moderate left inguinal hernia is noted, containing
only fat.

There is soft tissue inflammation about the course of the left
femoral artery and vein, extending superiorly along the course of
the left external iliac artery and vein, with trace associated free
fluid. As there is no evidence of thrombus, this is most compatible
with extension of left-sided phlebitis. Asymmetrically prominent
left inguinal nodes are seen, measuring up to 1.4 cm in short axis,
with mild associated soft tissue inflammation.

The soft tissue inflammation tracks about the course of the left
ureter; though the left ureter remains normal in caliber, there is
associated minimal left-sided hydronephrosis.

No acute osseous abnormalities are identified.
IMPRESSION: 1. Soft tissue inflammation about the course of the left femoral
artery and vein, extending superiorly along the left external iliac
artery and vein, with trace associated free fluid. This is most
compatible with extension of phlebitis from the left leg. No
evidence of thrombosis at this time.
2. Left inguinal lymphadenopathy, measuring up to 1.4 cm in short
axis, with mild associated soft tissue inflammation; this likely
reflects the underlying phlebitis.
3. Soft tissue inflammation tracks about the course of the left
ureter; though the left ureter remains normal in caliber, there is
associated minimal left-sided hydronephrosis. No obstructing
ureteral stones seen.
4. Developmental atrophy of the right kidney.
5. Small to moderate left inguinal hernia, containing only fat.

## 2014-10-14 ENCOUNTER — Telehealth: Payer: Self-pay | Admitting: Family Medicine

## 2014-10-14 NOTE — Telephone Encounter (Signed)
Patient called requesting medication refill on simvastatin (ZOCOR) 20 MG tablet and atenolol (TENORMIN) 50 MG tablet.Patient has an appt scheduled 10/26/14 and would like enough medications,  Please f/u with patient

## 2014-10-19 ENCOUNTER — Other Ambulatory Visit: Payer: Self-pay | Admitting: *Deleted

## 2014-10-19 DIAGNOSIS — E785 Hyperlipidemia, unspecified: Secondary | ICD-10-CM

## 2014-10-19 MED ORDER — ATENOLOL 50 MG PO TABS: 50.0000 mg | ORAL_TABLET | Freq: Every day | ORAL | Status: DC

## 2014-10-19 MED ORDER — SIMVASTATIN 20 MG PO TABS: 20.0000 mg | ORAL_TABLET | Freq: Every day | ORAL | Status: DC

## 2014-10-19 NOTE — Telephone Encounter (Signed)
Verified Date of Birth Notified Refills at [facility] pharmacy  Pt has appointment On 10/26/2014

## 2014-10-26 ENCOUNTER — Encounter: Payer: Self-pay | Admitting: Family Medicine

## 2014-10-26 ENCOUNTER — Ambulatory Visit: Payer: Self-pay | Attending: Family Medicine | Admitting: Family Medicine

## 2014-10-26 VITALS — BP 127/83 | HR 65 | Temp 98.2°F | Resp 16 | Ht 68.0 in | Wt 206.0 lb

## 2014-10-26 DIAGNOSIS — R7301 Impaired fasting glucose: Secondary | ICD-10-CM | POA: Insufficient documentation

## 2014-10-26 DIAGNOSIS — E785 Hyperlipidemia, unspecified: Secondary | ICD-10-CM | POA: Insufficient documentation

## 2014-10-26 DIAGNOSIS — I10 Essential (primary) hypertension: Secondary | ICD-10-CM | POA: Insufficient documentation

## 2014-10-26 DIAGNOSIS — I890 Lymphedema, not elsewhere classified: Secondary | ICD-10-CM

## 2014-10-26 LAB — LDL CHOLESTEROL, DIRECT: LDL DIRECT: 83 mg/dL (ref <130)

## 2014-10-26 MED ORDER — AMLODIPINE BESYLATE 10 MG PO TABS: 10.0000 mg | ORAL_TABLET | Freq: Every day | ORAL | Status: DC

## 2014-10-26 MED ORDER — HYDROCHLOROTHIAZIDE 25 MG PO TABS: ORAL_TABLET | ORAL | Status: DC

## 2014-10-26 MED ORDER — ATENOLOL 50 MG PO TABS: 50.0000 mg | ORAL_TABLET | Freq: Every day | ORAL | Status: DC

## 2014-10-26 NOTE — Assessment & Plan Note (Signed)
A: tolerating statin. LDL trending down  P:  Recheck d LDL

## 2014-10-26 NOTE — Progress Notes (Signed)
Establish Care with new  PCP F/U HTN and Cholesterol  Pt non fasting  No pain. No hx tobacco  Medicine refills

## 2014-10-26 NOTE — Assessment & Plan Note (Signed)
HTN: BP at goal Refilled meds Continue current regimen

## 2014-10-26 NOTE — Patient Instructions (Addendum)
Brian Wade,  Thank you for coming in today  1. HTN: BP at goal Refilled meds   2. High cholesterol Checking lipids today  Please apply for [place] discount and orange card, you can also inquire if any of your medications are on the PASS (medications assistance) list.   F/u next month for flu shot F/u with me in 4-6 months for HTN  Dr. Armen Pickup

## 2014-10-26 NOTE — Assessment & Plan Note (Signed)
A1c done today.

## 2014-10-26 NOTE — Progress Notes (Signed)
   Subjective:    Patient ID: Brian Wade, male    DOB: 12/31/1965, 49 y.o.   MRN: [id number] CC: HTN f/u  HPI  1. CHRONIC HYPERTENSION  Disease Monitoring  Blood pressure range: not checking   Chest pain: no   Dyspnea: no   Claudication: no   Medication compliance: yes  Medication Side Effects  Lightheadedness: no   Urinary frequency: no   Edema: yes, chronic in L leg    2. HLD: taking statin. No myalgias. Low fat diet. Not eating a low carb diet.   Social History  Substance Use Topics  . Smoking status: Never Smoker   . Smokeless tobacco: Not on file  . Alcohol Use: No    Review of Systems  Constitutional: Negative for fever, chills, fatigue and unexpected weight change.  Eyes: Negative for visual disturbance.  Respiratory: Negative for cough and shortness of breath.   Cardiovascular: Negative for chest pain, palpitations and leg swelling.  Gastrointestinal: Negative for nausea, vomiting, abdominal pain, diarrhea, constipation and blood in stool.  Endocrine: Negative for polydipsia, polyphagia and polyuria.  Musculoskeletal: Negative for myalgias, back pain, arthralgias, gait problem and neck pain.  Skin: Negative for rash.  Allergic/Immunologic: Negative for immunocompromised state.  Hematological: Negative for adenopathy. Does not bruise/bleed easily.  Psychiatric/Behavioral: Negative for suicidal ideas, sleep disturbance and dysphoric mood. The patient is not nervous/anxious.        Objective:   Physical Exam  Constitutional: He appears well-developed and well-nourished. No distress.  HENT:  Head: Normocephalic and atraumatic.  Neck: Normal range of motion. Neck supple.  Cardiovascular: Normal rate, regular rhythm, normal heart sounds and intact distal pulses.   Pulmonary/Chest: Effort normal and breath sounds normal.  Musculoskeletal: He exhibits edema (chronic L leg ).  Neurological: He is alert.  Skin: Skin is warm and dry. No rash noted. No erythema.    Psychiatric: He has a normal mood and affect.  BP 127/83 mmHg  Pulse 65  Temp(Src) 98.2 F (36.8 C) (Oral)  Resp 16  Ht  (1.727 m)  Wt 206 lb (93.441 kg)  BMI 31.33 kg/m2  SpO2 99%  Wt Readings from Last 3 Encounters:  10/26/14 206 lb (93.441 kg)  12/09/13 208 lb (94.348 kg)  03/27/13 207 lb (93.895 kg)        Assessment & Plan:

## 2014-11-10 ENCOUNTER — Telehealth: Payer: Self-pay | Admitting: *Deleted

## 2014-11-10 NOTE — Telephone Encounter (Signed)
LVM to return call.

## 2014-11-10 NOTE — Telephone Encounter (Signed)
-----   Message from Dessa Phi, MD sent at 10/27/2014  8:47 AM EDT ----- LDL improved continue current treatment

## 2015-03-15 MED FILL — ATENOLOL 50 MG TABLET: 50 | 90 days supply | Qty: 90 | Fill #1

## 2015-05-18 MED FILL — SIMVASTATIN 20 MG TABLET: 20 | 30 days supply | Qty: 30 | Fill #3

## 2015-06-10 ENCOUNTER — Other Ambulatory Visit: Payer: Self-pay | Admitting: Family Medicine

## 2015-06-14 MED FILL — ATENOLOL 50 MG TABLET: 50 | 30 days supply | Qty: 30 | Fill #0

## 2015-06-15 MED FILL — AMLODIPINE BESYLATE 10 MG T: 10 | 90 days supply | Qty: 90 | Fill #1

## 2015-06-15 MED FILL — ?SIMVASTATIN 20 MG TABLET: 20 MG | 30 days supply | Qty: 30 | Fill #4

## 2015-07-12 ENCOUNTER — Telehealth: Payer: Self-pay | Admitting: Family Medicine

## 2015-07-12 NOTE — Telephone Encounter (Signed)
Pt. Called requesting a refill on the following medications:  atenolol (TENORMIN) 50 MG tablet simvastatin (ZOCOR) 20 MG tablet   Pt. Called to make an appointment and was told to call back at the end of the month b/c His PCP does not have any appointments available until June. Please f/u with pt.

## 2015-07-14 ENCOUNTER — Other Ambulatory Visit: Payer: Self-pay | Admitting: Family Medicine

## 2015-07-14 MED FILL — ATENOLOL 50 MG TABLET: 50 | 30 days supply | Qty: 30 | Fill #0

## 2015-07-14 MED FILL — ?SIMVASTATIN 20 MG TABLET: 20 MG | 30 days supply | Qty: 30 | Fill #0

## 2015-07-19 NOTE — Telephone Encounter (Signed)
Noted meds refilled on 07/14/2015

## 2015-08-18 ENCOUNTER — Other Ambulatory Visit: Payer: Self-pay | Admitting: Family Medicine

## 2015-08-18 MED FILL — ?ATENOLOL 50 MG TABLET: 50 | 30 days supply | Qty: 30 | Fill #1

## 2015-08-18 MED FILL — ?SIMVASTATIN 20 MG TABLET: 20 MG | 30 days supply | Qty: 30 | Fill #0

## 2015-09-10 ENCOUNTER — Encounter: Payer: Self-pay | Admitting: Family Medicine

## 2015-09-10 ENCOUNTER — Ambulatory Visit: Payer: Self-pay | Attending: Family Medicine | Admitting: Family Medicine

## 2015-09-10 VITALS — BP 128/70 | HR 74 | Temp 98.0°F | Resp 18 | Ht 65.0 in | Wt 217.0 lb

## 2015-09-10 DIAGNOSIS — I10 Essential (primary) hypertension: Secondary | ICD-10-CM

## 2015-09-10 DIAGNOSIS — E785 Hyperlipidemia, unspecified: Secondary | ICD-10-CM

## 2015-09-10 DIAGNOSIS — Z79899 Other long term (current) drug therapy: Secondary | ICD-10-CM | POA: Insufficient documentation

## 2015-09-10 DIAGNOSIS — Z114 Encounter for screening for human immunodeficiency virus [HIV]: Secondary | ICD-10-CM

## 2015-09-10 LAB — LIPID PANEL
Cholesterol: 123 mg/dL — ABNORMAL LOW (ref 125–200)
HDL: 33 mg/dL — AB (ref >=40)
LDL CALC: 69 mg/dL (ref <130)
Total CHOL/HDL Ratio: 3.7 Ratio (ref <=5.0)
Triglycerides: 103 mg/dL (ref <150)
VLDL: 21 mg/dL (ref <30)

## 2015-09-10 LAB — COMPLETE METABOLIC PANEL WITH GFR
ALT: 13 U/L (ref 9–46)
AST: 17 U/L (ref 10–35)
Albumin: 4 g/dL (ref 3.6–5.1)
Alkaline Phosphatase: 61 U/L (ref 40–115)
BILIRUBIN TOTAL: 0.6 mg/dL (ref 0.2–1.2)
BUN: 9 mg/dL (ref 7–25)
CO2: 23 mmol/L (ref 20–31)
CREATININE: 1.04 mg/dL (ref 0.70–1.33)
Calcium: 9.2 mg/dL (ref 8.6–10.3)
Chloride: 104 mmol/L (ref 98–110)
GFR, Est African American: >89 mL/min (ref >=60)
GFR, Est Non African American: 83 mL/min (ref >=60)
GLUCOSE: 93 mg/dL (ref 65–99)
Potassium: 3.9 mmol/L (ref 3.5–5.3)
SODIUM: 139 mmol/L (ref 135–146)
TOTAL PROTEIN: 7.9 g/dL (ref 6.1–8.1)

## 2015-09-10 LAB — POCT GLYCOSYLATED HEMOGLOBIN (HGB A1C): Hemoglobin A1C: 5.2

## 2015-09-10 MED ORDER — AMLODIPINE BESYLATE 10 MG PO TABS: 10.0000 mg | ORAL_TABLET | Freq: Every day | ORAL | Status: DC

## 2015-09-10 MED ORDER — SIMVASTATIN 20 MG PO TABS: 20.0000 mg | ORAL_TABLET | Freq: Every day | ORAL | Status: DC

## 2015-09-10 MED ORDER — ATENOLOL 50 MG PO TABS: 50.0000 mg | ORAL_TABLET | Freq: Every day | ORAL | Status: DC

## 2015-09-10 MED FILL — ?SIMVASTATIN 20 MG TABLET: 20 MG | 30 days supply | Qty: 30 | Fill #0

## 2015-09-10 MED FILL — AMLODIPINE BESYLATE 10 MG T: 10 | 30 days supply | Qty: 30 | Fill #0

## 2015-09-10 MED FILL — ATENOLOL 25 MG TABLET: 25 | 30 days supply | Qty: 60 | Fill #0

## 2015-09-10 NOTE — Assessment & Plan Note (Signed)
Well controlled on norvasc 10 mg and atenolol 50 mg  Refilled both 6 month f/u

## 2015-09-10 NOTE — Progress Notes (Signed)
Patient is here for HTN FU  Patient denies pain at this time.  Patient has taken medication today. Patient has not eaten today.

## 2015-09-10 NOTE — Progress Notes (Signed)
Subjective:  Patient ID: Brian Wade, male    DOB: [birth year]  Age: 50 y.o. MRN: [id number]  CC: Hypertension   HPI Brian Wade presents for   1. HTN: he is compliant with norvasc 10 mg and atenolol 50 mg. Has not taking HCTZ 25 mg for many months. No HA, CP, SOb or edema.  2. HLD: compliant with lipitor. No myalgias. Non smoker. Has HTN. No DM2. Request screening for diabetes.  3. HM: declines c-scope. Amenable to screening HIV on today's blood work. Declines Tdap.   Social History  Substance Use Topics  . Smoking status: Never Smoker   . Smokeless tobacco: Not on file  . Alcohol Use: No    Outpatient Prescriptions Prior to Visit  Medication Sig Dispense Refill  . amLODipine (NORVASC) 10 MG tablet Take 1 tablet (10 mg total) by mouth daily. 30 tablet 5  . atenolol (TENORMIN) 50 MG tablet TAKE 1 TABLET BY MOUTH DAILY. NEEDS OFFICE VISIT FOR REFILLS 30 tablet 0  . hydrochlorothiazide (HYDRODIURIL) 25 MG tablet TAKE 1 TABLET BY MOUTH DAILY. 30 tablet 5  . simvastatin (ZOCOR) 20 MG tablet Take 1 tablet (20 mg total) by mouth daily at 6 PM. Must have office visit for refills 30 tablet 0   No facility-administered medications prior to visit.    ROS Review of Systems  Constitutional: Negative for fever, chills, fatigue and unexpected weight change.  HENT: Positive for dental problem.   Eyes: Negative for visual disturbance.  Respiratory: Negative for cough and shortness of breath.   Cardiovascular: Negative for chest pain, palpitations and leg swelling.  Gastrointestinal: Negative for nausea, vomiting, abdominal pain, diarrhea, constipation and blood in stool.  Endocrine: Negative for polydipsia, polyphagia and polyuria.  Musculoskeletal: Negative for myalgias, back pain, arthralgias, gait problem and neck pain.  Skin: Negative for rash.  Allergic/Immunologic: Negative for immunocompromised state.  Neurological: Negative for dizziness, light-headedness and headaches.    Hematological: Negative for adenopathy. Does not bruise/bleed easily.  Psychiatric/Behavioral: Negative for suicidal ideas, sleep disturbance and dysphoric mood. The patient is not nervous/anxious.     Objective:  BP 128/70 mmHg  Pulse 74  Temp(Src) 98 F (36.7 C) (Oral)  Resp 18  Ht  (1.651 m)  Wt 217 lb (98.431 kg)  BMI 36.11 kg/m2  SpO2 100%  BP/Weight 09/10/2015 10/26/2014 12/09/2013  Systolic BP 128 127 127  Diastolic BP 70 83 81  Wt. (Lbs) 217 206 208  BMI 36.11 31.33 31.16   Physical Exam  Constitutional: He appears well-developed and well-nourished. No distress.  HENT:  Head: Normocephalic and atraumatic.  Mouth/Throat: Abnormal dentition.  Neck: Normal range of motion. Neck supple.  Cardiovascular: Normal rate, regular rhythm, normal heart sounds and intact distal pulses.   Pulmonary/Chest: Effort normal and breath sounds normal.  Musculoskeletal: He exhibits no edema.  Neurological: He is alert.  Skin: Skin is warm and dry. No rash noted. No erythema.  Psychiatric: He has a normal mood and affect.     Assessment & Plan:   There are no diagnoses linked to this encounter.  No orders of the defined types were placed in this encounter.   Maximos was seen today for hypertension.  Diagnoses and all orders for this visit:  Essential hypertension -     amLODipine (NORVASC) 10 MG tablet; Take 1 tablet (10 mg total) by mouth daily. -     atenolol (TENORMIN) 50 MG tablet; Take 1 tablet (50 mg total) by mouth daily. -  COMPLETE METABOLIC PANEL WITH GFR -     HgB A1c  HLD (hyperlipidemia) -     simvastatin (ZOCOR) 20 MG tablet; Take 1 tablet (20 mg total) by mouth daily at 6 PM. -     Lipid Panel -     HgB A1c  Screening for HIV (human immunodeficiency virus) -     HIV antibody (with reflex)   Follow-up: Return in about 6 months (around 03/12/2016) for HTN .   Dessa PhiJosalyn Francia Verry MD

## 2015-09-10 NOTE — Assessment & Plan Note (Signed)
Compliant with and tolerating simvastatin Refilled simvastatin Checking lipids today  Screening A1c

## 2015-09-10 NOTE — Patient Instructions (Addendum)
Brian Wade was seen today for hypertension.  Diagnoses and all orders for this visit:  Essential hypertension -     amLODipine (NORVASC) 10 MG tablet; Take 1 tablet (10 mg total) by mouth daily. -     atenolol (TENORMIN) 50 MG tablet; Take 1 tablet (50 mg total) by mouth daily. -     COMPLETE METABOLIC PANEL WITH GFR -     HgB A1c  HLD (hyperlipidemia) -     simvastatin (ZOCOR) 20 MG tablet; Take 1 tablet (20 mg total) by mouth daily at 6 PM. -     Lipid Panel -     HgB A1c  Screening for HIV (human immunodeficiency virus) -     HIV antibody (with reflex)    F/u in 6 months for HTN check   Dr. Armen PickupFunches

## 2015-09-11 LAB — HIV ANTIBODY (ROUTINE TESTING W REFLEX): HIV: NONREACTIVE

## 2015-10-21 ENCOUNTER — Other Ambulatory Visit: Payer: Self-pay | Admitting: Family Medicine

## 2015-10-21 DIAGNOSIS — I10 Essential (primary) hypertension: Secondary | ICD-10-CM

## 2015-10-21 MED ORDER — METOPROLOL SUCCINATE ER 25 MG PO TB24: 25.0000 mg | ORAL_TABLET | Freq: Every day | ORAL | 2 refills | Status: DC

## 2015-10-21 MED FILL — METOPROLOL SUCC ER 25 MG TA: 25 | 30 days supply | Qty: 30 | Fill #0

## 2015-10-21 MED FILL — SIMVASTATIN 20 MG TABLET: 20 | 30 days supply | Qty: 30 | Fill #1

## 2015-10-21 MED FILL — AMLODIPINE BESYLATE 10 MG T: 10 | 30 days supply | Qty: 30 | Fill #1

## 2015-10-21 NOTE — Telephone Encounter (Signed)
Please inform patient that metoprolol was ordered to replace atenolol which is on back order

## 2015-10-22 NOTE — Telephone Encounter (Signed)
Pt was called on 8/25 and informed of his medication being changed and sent to the pharmacy.

## 2015-10-28 ENCOUNTER — Other Ambulatory Visit: Payer: Self-pay | Admitting: Family Medicine

## 2015-10-28 DIAGNOSIS — I10 Essential (primary) hypertension: Secondary | ICD-10-CM

## 2015-10-28 MED ORDER — METOPROLOL SUCCINATE ER 50 MG PO TB24: 50.0000 mg | ORAL_TABLET | Freq: Every day | ORAL | 3 refills | Status: DC

## 2015-10-28 MED FILL — METOPROLOL SUCC ER 50 MG TA: 50 | 30 days supply | Qty: 30 | Fill #0

## 2015-11-22 MED FILL — AMLODIPINE BESYLATE 10 MG T: 10 | 30 days supply | Qty: 30 | Fill #2

## 2015-11-22 MED FILL — SIMVASTATIN 20 MG TABLET: 20 | 30 days supply | Qty: 30 | Fill #2

## 2015-12-17 MED FILL — AMLODIPINE BESYLATE 10 MG T: 10 | 30 days supply | Qty: 30 | Fill #3

## 2015-12-17 MED FILL — SIMVASTATIN 20 MG TABLET: 20 | 30 days supply | Qty: 30 | Fill #3

## 2015-12-21 MED FILL — METOPROLOL SUCC ER 50 MG TA: 50 | 30 days supply | Qty: 30 | Fill #1

## 2016-01-12 MED FILL — AMLODIPINE BESYLATE 10 MG T: 10 | 30 days supply | Qty: 30 | Fill #4

## 2016-01-12 MED FILL — SIMVASTATIN 20 MG TABLET: 20 | 30 days supply | Qty: 30 | Fill #4

## 2016-01-18 MED FILL — METOPROLOL SUCC ER 50 MG TA: 50 | 30 days supply | Qty: 30 | Fill #2

## 2016-02-17 MED FILL — AMLODIPINE BESYLATE 10 MG T: 10 | 30 days supply | Qty: 30 | Fill #5

## 2016-02-17 MED FILL — ?SIMVASTATIN 20 MG TABLET: 20 MG | 30 days supply | Qty: 30 | Fill #5

## 2016-02-17 MED FILL — METOPROLOL SUCC ER 50 MG TA: 50 | 30 days supply | Qty: 30 | Fill #3

## 2016-03-20 MED FILL — AMLODIPINE BESYLATE 10 MG T: 10 | 30 days supply | Qty: 30 | Fill #6

## 2016-03-20 MED FILL — METOPROLOL SUCC ER 50 MG TA: 50 | 30 days supply | Qty: 30 | Fill #4

## 2016-03-20 MED FILL — ?SIMVASTATIN 20 MG TABLET: 20 MG | 30 days supply | Qty: 30 | Fill #6

## 2016-03-21 ENCOUNTER — Encounter (HOSPITAL_COMMUNITY): Payer: Self-pay | Admitting: Emergency Medicine

## 2016-03-21 ENCOUNTER — Emergency Department (HOSPITAL_COMMUNITY)
Admission: EM | Admit: 2016-03-21 | Discharge: 2016-03-21 | Disposition: A | Discharge status: Home / Self Care | Payer: Self-pay | Attending: Emergency Medicine | Admitting: Emergency Medicine

## 2016-03-21 DIAGNOSIS — K0889 Other specified disorders of teeth and supporting structures: Secondary | ICD-10-CM | POA: Insufficient documentation

## 2016-03-21 DIAGNOSIS — Z79899 Other long term (current) drug therapy: Secondary | ICD-10-CM | POA: Insufficient documentation

## 2016-03-21 DIAGNOSIS — I10 Essential (primary) hypertension: Secondary | ICD-10-CM | POA: Insufficient documentation

## 2016-03-21 MED ORDER — CLINDAMYCIN HCL 150 MG PO CAPS: 150.0000 mg | ORAL_CAPSULE | Freq: Three times a day (TID) | ORAL | 0 refills | Status: DC

## 2016-03-21 MED ORDER — CLINDAMYCIN HCL 150 MG PO CAPS
300.0000 mg | ORAL_CAPSULE | Freq: Once | ORAL | Status: AC
  Administered 2016-03-21: 300 mg via ORAL
  Filled 2016-03-21: qty 2

## 2016-03-21 NOTE — Discharge Instructions (Signed)
See your dentist as soon as possible for tooth extraction

## 2016-03-21 NOTE — ED Provider Notes (Signed)
[facility]-EMERGENCY DEPT Provider Note   CSN: [id number] Arrival date & time: 03/21/16  1723     History   Chief Complaint Chief Complaint  Patient presents with  . Dental Pain    HPI Brian Wade is a 51 y.o. male.His chief complaint is tooth pain.  HPI:  He states he's had dental pain for years. He is had multiple dental infections. He has had multiple dental extractions. He has his mandibular central incisors remaining. He states he been painful for the last week. He is given a limited swelling around them. He states typically in the past this has responded well to about treatment. He states that he knows it is "on him" to see a dentist. He declines any pain medication stating that "Motrin has been working".  Past Medical History:  Diagnosis Date  . Hyperlipidemia 2011  . Hypertension 2010  . Lymphedema     Patient Active Problem List   Diagnosis Date Noted  . HTN (hypertension) 02/18/2013  . HLD (hyperlipidemia) 02/18/2013    Past Surgical History:  Procedure Laterality Date  . NO PAST SURGERIES         Home Medications    Prior to Admission medications   Medication Sig Start Date End Date Taking? Authorizing Provider  amLODipine (NORVASC) 10 MG tablet Take 1 tablet (10 mg total) by mouth daily. 09/10/15   [name] Funches, MD  clindamycin (CLEOCIN) 150 MG capsule Take 1 capsule (150 mg total) by mouth 3 (three) times daily. 03/21/16   Rolland Porter, MD  metoprolol succinate (TOPROL-XL) 50 MG 24 hr tablet Take 1 tablet (50 mg total) by mouth daily. 10/28/15   [name] Funches, MD  simvastatin (ZOCOR) 20 MG tablet Take 1 tablet (20 mg total) by mouth daily at 6 PM. 09/10/15   Dessa Phi, MD    Family History Family History  Problem Relation Age of Onset  . Heart disease Mother   . Hypertension Mother   . Asthma Sister   . Diabetes Maternal Aunt   . Cancer Maternal Grandmother     Social History Social History  Substance Use Topics  . Smoking status: Never  Smoker  . Smokeless tobacco: Never Used  . Alcohol use No     Allergies   Penicillins   Review of Systems Review of Systems  Constitutional: Negative for appetite change, chills, diaphoresis, fatigue and fever.  HENT: Positive for dental problem. Negative for mouth sores, sore throat and trouble swallowing.   Eyes: Negative for visual disturbance.  Respiratory: Negative for cough, chest tightness, shortness of breath and wheezing.   Cardiovascular: Negative for chest pain.  Gastrointestinal: Negative for abdominal distention, abdominal pain, diarrhea, nausea and vomiting.  Endocrine: Negative for polydipsia, polyphagia and polyuria.  Genitourinary: Negative for dysuria, frequency and hematuria.  Musculoskeletal: Negative for gait problem.  Skin: Negative for color change, pallor and rash.  Neurological: Negative for dizziness, syncope, light-headedness and headaches.  Hematological: Does not bruise/bleed easily.  Psychiatric/Behavioral: Negative for behavioral problems and confusion.     Physical Exam Updated Vital Signs BP 136/88 (BP Location: Left Arm)   Pulse 62   Temp 97.7 F (36.5 C) (Oral)   Resp 14   Ht 5\' 8"  (1.727 m)   Wt 210 lb (95.3 kg)   SpO2 99%   BMI 31.93 kg/m   Physical Exam  HENT:  Mouth/Throat:       ED Treatments / Results  Labs (all labs ordered are listed, but only abnormal results are displayed)  Labs Reviewed - No data to display  EKG  EKG Interpretation None       Radiology No results found.  Procedures Procedures (including critical care time)  Medications Ordered in ED Medications  clindamycin (CLEOCIN) capsule 300 mg (not administered)     Initial Impression / Assessment and Plan / ED Course  I have reviewed the triage vital signs and the nursing notes.  Pertinent labs & imaging results that were available during my care of the patient were reviewed by me and considered in my medical decision making (see chart for  details).     Patient has penicillin allergy. We'll begin clindamycin by mouth. Prescription for 150 3 times a day 10 days. Dental follow-up encouraged.  Final Clinical Impressions(s) / ED Diagnoses   Final diagnoses:  Pain, dental    New Prescriptions New Prescriptions   CLINDAMYCIN (CLEOCIN) 150 MG CAPSULE    Take 1 capsule (150 mg total) by mouth 3 (three) times daily.     Rolland PorterMark Shante, MD 03/21/16 2039

## 2016-03-21 NOTE — ED Notes (Signed)
Pt verbalized understanding of d/c instructions and has no further questions. Pt is stable, A&Ox4, VSS.  

## 2016-03-21 NOTE — ED Triage Notes (Signed)
Pt states he has had problems with rotten teeth. He has lost most of his lower teeth. Pt states he is here today because his lower gums feel swollen and his lower teeth he has left are hurting.

## 2016-04-17 MED FILL — METOPROLOL SUCC ER 50 MG TA: 50 | 30 days supply | Qty: 30 | Fill #5

## 2016-04-17 MED FILL — ?SIMVASTATIN 20 MG TABLET: 20 MG | 30 days supply | Qty: 30 | Fill #7

## 2016-04-17 MED FILL — AMLODIPINE BESYLATE 10 MG T: 10 | 30 days supply | Qty: 30 | Fill #7

## 2016-05-15 MED FILL — ?METOPROLOL SUCC ER 50 MG: 50 MG | 30 days supply | Qty: 30 | Fill #6

## 2016-05-15 MED FILL — ?AMLODIPINE BESYLATE 10 MG: 10 | 30 days supply | Qty: 30 | Fill #8

## 2016-05-15 MED FILL — ?SIMVASTATIN 20 MG TABLET: 20 MG | 30 days supply | Qty: 30 | Fill #8

## 2016-06-19 MED FILL — ?AMLODIPINE BESYLATE 10 MG: 10 | 30 days supply | Qty: 30 | Fill #9

## 2016-06-19 MED FILL — ?METOPROLOL SUCC ER 50 MG: 50 MG | 30 days supply | Qty: 30 | Fill #7

## 2016-06-19 MED FILL — SIMVASTATIN 20 MG TABLET: 20 | 30 days supply | Qty: 30 | Fill #9

## 2016-07-18 MED FILL — ?AMLODIPINE BESYLATE 10 MG: 10 | 30 days supply | Qty: 30 | Fill #10

## 2016-07-18 MED FILL — SIMVASTATIN 20 MG TABLET: 20 | 30 days supply | Qty: 30 | Fill #10

## 2016-07-18 MED FILL — ?METOPROLOL SUCC ER 50 MG: 50 MG | 30 days supply | Qty: 30 | Fill #8

## 2016-08-18 ENCOUNTER — Other Ambulatory Visit: Payer: Self-pay | Admitting: Family Medicine

## 2016-08-18 DIAGNOSIS — I10 Essential (primary) hypertension: Secondary | ICD-10-CM

## 2016-08-18 DIAGNOSIS — E785 Hyperlipidemia, unspecified: Secondary | ICD-10-CM

## 2016-08-18 MED FILL — AMLODIPINE BESYLATE 10 MG T: 10 | 30 days supply | Qty: 30 | Fill #11

## 2016-08-18 MED FILL — ?METOPROLOL SUCC ER 50 MG: 50 MG | 30 days supply | Qty: 30 | Fill #9

## 2016-08-18 MED FILL — SIMVASTATIN 20 MG TABLET: 20 | 30 days supply | Qty: 30 | Fill #11

## 2016-09-11 ENCOUNTER — Other Ambulatory Visit: Payer: Self-pay | Admitting: Family Medicine

## 2016-09-11 DIAGNOSIS — E785 Hyperlipidemia, unspecified: Secondary | ICD-10-CM

## 2016-09-11 DIAGNOSIS — I10 Essential (primary) hypertension: Secondary | ICD-10-CM

## 2016-09-11 MED FILL — ?METOPROLOL SUCC ER 50 MG: 50 MG | 30 days supply | Qty: 30 | Fill #10

## 2016-09-18 ENCOUNTER — Other Ambulatory Visit: Payer: Self-pay | Admitting: Family Medicine

## 2016-09-18 DIAGNOSIS — I10 Essential (primary) hypertension: Secondary | ICD-10-CM

## 2016-09-18 DIAGNOSIS — E785 Hyperlipidemia, unspecified: Secondary | ICD-10-CM

## 2016-09-19 ENCOUNTER — Telehealth: Payer: Self-pay | Admitting: Family Medicine

## 2016-09-19 DIAGNOSIS — E7800 Pure hypercholesterolemia, unspecified: Secondary | ICD-10-CM

## 2016-09-19 DIAGNOSIS — I10 Essential (primary) hypertension: Secondary | ICD-10-CM

## 2016-09-19 NOTE — Telephone Encounter (Signed)
Pt came tot the office to request a refill for the following med simvastatin (ZOCOR) 20 MG tablet  amLODipine (NORVASC) 10 MG tablet  Please follow up

## 2016-09-20 NOTE — Telephone Encounter (Signed)
Will route to PCP. Pt has not been seen in a year.

## 2016-09-21 MED ORDER — SIMVASTATIN 20 MG PO TABS
20.0000 mg | ORAL_TABLET | Freq: Every day | ORAL | 0 refills | Status: DC
Start: 2016-09-21 — End: 2016-09-28

## 2016-09-21 MED ORDER — AMLODIPINE BESYLATE 10 MG PO TABS: 10.0000 mg | ORAL_TABLET | Freq: Every day | ORAL | 0 refills | Status: DC

## 2016-09-21 MED ORDER — METOPROLOL SUCCINATE ER 50 MG PO TB24: 50.0000 mg | ORAL_TABLET | Freq: Every day | ORAL | 0 refills | Status: DC

## 2016-09-21 NOTE — Telephone Encounter (Signed)
Sent in refills x one month supply Needs OV for additional refills Has appt on 09/28/16

## 2016-09-28 ENCOUNTER — Encounter: Payer: Self-pay | Admitting: Family Medicine

## 2016-09-28 ENCOUNTER — Ambulatory Visit: Payer: Self-pay | Attending: Family Medicine | Admitting: Family Medicine

## 2016-09-28 VITALS — BP 139/85 | HR 65 | Temp 98.5°F | Ht 65.0 in | Wt 225.8 lb

## 2016-09-28 DIAGNOSIS — I10 Essential (primary) hypertension: Secondary | ICD-10-CM | POA: Insufficient documentation

## 2016-09-28 DIAGNOSIS — E7800 Pure hypercholesterolemia, unspecified: Secondary | ICD-10-CM | POA: Insufficient documentation

## 2016-09-28 DIAGNOSIS — E785 Hyperlipidemia, unspecified: Secondary | ICD-10-CM | POA: Insufficient documentation

## 2016-09-28 DIAGNOSIS — I890 Lymphedema, not elsewhere classified: Secondary | ICD-10-CM | POA: Insufficient documentation

## 2016-09-28 DIAGNOSIS — R220 Localized swelling, mass and lump, head: Secondary | ICD-10-CM | POA: Insufficient documentation

## 2016-09-28 LAB — POCT GLYCOSYLATED HEMOGLOBIN (HGB A1C): HEMOGLOBIN A1C: 5

## 2016-09-28 MED ORDER — SIMVASTATIN 20 MG PO TABS: 20.0000 mg | ORAL_TABLET | Freq: Every day | ORAL | 3 refills | Status: DC

## 2016-09-28 MED ORDER — CLINDAMYCIN HCL 150 MG PO CAPS: 150.0000 mg | ORAL_CAPSULE | Freq: Three times a day (TID) | ORAL | 0 refills | Status: DC

## 2016-09-28 MED ORDER — AMLODIPINE BESYLATE 10 MG PO TABS: 10.0000 mg | ORAL_TABLET | Freq: Every day | ORAL | 3 refills | Status: DC

## 2016-09-28 MED ORDER — METOPROLOL SUCCINATE ER 50 MG PO TB24: 50.0000 mg | ORAL_TABLET | Freq: Every day | ORAL | 3 refills | Status: DC

## 2016-09-28 MED FILL — SIMVASTATIN 20 MG TABLET: 20 | 30 days supply | Qty: 30 | Fill #0

## 2016-09-28 MED FILL — CLINDAMYCIN HCL 150 MG CAPS: 150 | 10 days supply | Qty: 30 | Fill #0

## 2016-09-28 MED FILL — METOPROLOL SUCC ER 50 MG TA: 50 | 30 days supply | Qty: 30 | Fill #0

## 2016-09-28 MED FILL — AMLODIPINE BESYLATE 10 MG T: 10 | 30 days supply | Qty: 30 | Fill #0

## 2016-09-28 NOTE — Patient Instructions (Addendum)
Brian Wade was seen today for hypertension and hyperlipidemia.  Diagnoses and all orders for this visit:  Essential hypertension -     CMP14+EGFR -     HgB A1c -     amLODipine (NORVASC) 10 MG tablet; Take 1 tablet (10 mg total) by mouth daily. Needs office visit -     metoprolol succinate (TOPROL-XL) 50 MG 24 hr tablet; Take 1 tablet (50 mg total) by mouth daily. Needs office visit  Pure hypercholesterolemia -     Lipid Panel -     HgB A1c -     simvastatin (ZOCOR) 20 MG tablet; Take 1 tablet (20 mg total) by mouth daily at 6 PM. Needs office visit  Gingival swelling -     clindamycin (CLEOCIN) 150 MG capsule; Take 1 capsule (150 mg total) by mouth 3 (three) times daily.  Lymphedema of left leg   Return  for flu shot in 1-2 months flu clinic or RN visit   return in 6 months for HTN and HLD, sooner if needed  Dr. Adrian Blackwater

## 2016-09-28 NOTE — Progress Notes (Signed)
Subjective:  Patient ID: Brian Wade, male    DOB: 05-09-1965  Age: 51 y.o. MRN: [id number]  CC: Hypertension and Hyperlipidemia   HPI Brian Wade has HTN, HLD, left leg lymphedema, he is unemployed and uninsured  he  presents for   1. Hypertension: he is compliant with norvasc 10 mg and metoprolol  50 mg. He reports he did run out of amlodipine for a while.  No HA, CP, SOb or edema.  2. Hyperlipidemia: compliant with lipitor. He ran out about 3 days ago. No myalgias. Non smoker. Has HTN. No DM2. Request screening for diabetes.  3.  Weight gain: he reports his dad died in 03/01/2016. He developed a bit of depression. He started overeating. He has been unemployed for 3 years.   4. Gum swelling: R upper swelling. For 4-5 days. Mild pain. No fever. He has poor dentition other than the upper bridge work he had done several years ago.    Social History  Substance Use Topics  . Smoking status: Never Smoker  . Smokeless tobacco: Never Used  . Alcohol use No    Outpatient Medications Prior to Visit  Medication Sig Dispense Refill  . amLODipine (NORVASC) 10 MG tablet Take 1 tablet (10 mg total) by mouth daily. Needs office visit 30 tablet 0  . metoprolol succinate (TOPROL-XL) 50 MG 24 hr tablet Take 1 tablet (50 mg total) by mouth daily. Needs office visit 30 tablet 0  . simvastatin (ZOCOR) 20 MG tablet Take 1 tablet (20 mg total) by mouth daily at 6 PM. Needs office visit 30 tablet 0   No facility-administered medications prior to visit.     ROS Review of Systems  Constitutional: Negative for chills, fatigue, fever and unexpected weight change.  HENT: Positive for dental problem.   Eyes: Negative for visual disturbance.  Respiratory: Negative for cough and shortness of breath.   Cardiovascular: Negative for chest pain, palpitations and leg swelling.  Gastrointestinal: Negative for abdominal pain, blood in stool, constipation, diarrhea, nausea and vomiting.  Endocrine: Negative for  polydipsia, polyphagia and polyuria.  Musculoskeletal: Negative for arthralgias, back pain, gait problem, myalgias and neck pain.  Skin: Negative for rash.  Allergic/Immunologic: Negative for immunocompromised state.  Neurological: Negative for dizziness, light-headedness and headaches.  Hematological: Negative for adenopathy. Does not bruise/bleed easily.  Psychiatric/Behavioral: Negative for dysphoric mood, sleep disturbance and suicidal ideas. The patient is not nervous/anxious.     Objective:  BP 139/85   Pulse 65   Temp 98.5 F (36.9 C) (Oral)   Ht 5' 5" (1.651 m)   Wt 225 lb 12.8 oz (102.4 kg)   SpO2 97%   BMI 37.58 kg/m   BP/Weight 09/28/2016 03/21/2016 8/65/7846  Systolic BP 962 952 841  Diastolic BP 85 89 70  Wt. (Lbs) 225.8 210 217  BMI 37.58 31.93 36.11   Physical Exam  Constitutional: He appears well-developed and well-nourished. No distress.  HENT:  Head: Normocephalic and atraumatic.  Mouth/Throat: Abnormal dentition.    Neck: Normal range of motion. Neck supple.  Cardiovascular: Normal rate, regular rhythm, normal heart sounds and intact distal pulses.   Pulmonary/Chest: Effort normal and breath sounds normal.  Musculoskeletal: He exhibits edema (left leg ).  Neurological: He is alert.  Skin: Skin is warm and dry. No rash noted. No erythema.  Psychiatric: He has a normal mood and affect.    Lab Results  Component Value Date   HGBA1C 5.2 09/10/2015    Assessment & Plan:  Brian Wade  was seen today for hypertension and hyperlipidemia.  Diagnoses and all orders for this visit:  Essential hypertension -     CMP14+EGFR -     HgB A1c -     amLODipine (NORVASC) 10 MG tablet; Take 1 tablet (10 mg total) by mouth daily. Needs office visit -     metoprolol succinate (TOPROL-XL) 50 MG 24 hr tablet; Take 1 tablet (50 mg total) by mouth daily. Needs office visit  Pure hypercholesterolemia -     Lipid Panel -     HgB A1c -     simvastatin (ZOCOR) 20 MG tablet;  Take 1 tablet (20 mg total) by mouth daily at 6 PM. Needs office visit  Gingival swelling -     clindamycin (CLEOCIN) 150 MG capsule; Take 1 capsule (150 mg total) by mouth 3 (three) times daily.   There are no diagnoses linked to this encounter.  No orders of the defined types were placed in this encounter.  Brian Wade was seen today for hypertension and hyperlipidemia.  Diagnoses and all orders for this visit:  Essential hypertension -     CMP14+EGFR -     HgB A1c -     amLODipine (NORVASC) 10 MG tablet; Take 1 tablet (10 mg total) by mouth daily. Needs office visit -     metoprolol succinate (TOPROL-XL) 50 MG 24 hr tablet; Take 1 tablet (50 mg total) by mouth daily. Needs office visit  Pure hypercholesterolemia -     Lipid Panel -     HgB A1c -     simvastatin (ZOCOR) 20 MG tablet; Take 1 tablet (20 mg total) by mouth daily at 6 PM. Needs office visit  Gingival swelling -     clindamycin (CLEOCIN) 150 MG capsule; Take 1 capsule (150 mg total) by mouth 3 (three) times daily.  Lymphedema of left leg   Follow-up: Return in about 1 month (around 10/29/2016) for flu shot.   Boykin Nearing MD

## 2016-09-28 NOTE — Assessment & Plan Note (Addendum)
A: HTN a bit anove goal  Med: compliant P: Continue amlodipine 10 mg  Metoprolol 50 mg XL daily  Consider change in amlodipine to HCTZ given chronic left leg lymphedema

## 2016-09-28 NOTE — Assessment & Plan Note (Signed)
Recurrent, poor dentition At risk for abscess Course of clindamycin Advised he apply for orange card for dental referral

## 2016-09-28 NOTE — Assessment & Plan Note (Addendum)
Fasting lipids today  Refilled zocor A1c screen normal   Cholesterol panel with improved HDL, LDL is elevated continue simvastatin 20 mg this a moderate intensity statin which is recommended.

## 2016-09-28 NOTE — Assessment & Plan Note (Signed)
Chronic. 

## 2016-09-29 LAB — CMP14+EGFR
A/G RATIO: 1.3 (ref 1.2–2.2)
ALT: 29 IU/L (ref 0–44)
AST: 33 IU/L (ref 0–40)
Albumin: 4.3 g/dL (ref 3.5–5.5)
Alkaline Phosphatase: 93 IU/L (ref 39–117)
BILIRUBIN TOTAL: 0.4 mg/dL (ref 0.0–1.2)
BUN/Creatinine Ratio: 8 — ABNORMAL LOW (ref 9–20)
BUN: 10 mg/dL (ref 6–24)
CHLORIDE: 102 mmol/L (ref 96–106)
CO2: 24 mmol/L (ref 20–29)
Calcium: 9.4 mg/dL (ref 8.7–10.2)
Creatinine, Ser: 1.25 mg/dL (ref 0.76–1.27)
GFR calc Af Amer: 76 mL/min/{1.73_m2} (ref >59)
GFR calc non Af Amer: 66 mL/min/{1.73_m2} (ref >59)
GLUCOSE: 99 mg/dL (ref 65–99)
Globulin, Total: 3.3 g/dL (ref 1.5–4.5)
POTASSIUM: 4 mmol/L (ref 3.5–5.2)
Sodium: 139 mmol/L (ref 134–144)
TOTAL PROTEIN: 7.6 g/dL (ref 6.0–8.5)

## 2016-09-29 LAB — LIPID PANEL
CHOL/HDL RATIO: 4.7 ratio (ref 0.0–5.0)
Cholesterol, Total: 169 mg/dL (ref 100–199)
HDL: 36 mg/dL — AB (ref >39)
LDL Calculated: 117 mg/dL — ABNORMAL HIGH (ref 0–99)
Triglycerides: 81 mg/dL (ref 0–149)
VLDL Cholesterol Cal: 16 mg/dL (ref 5–40)

## 2016-11-01 MED FILL — SIMVASTATIN 20 MG TABLET: 20 | 30 days supply | Qty: 30 | Fill #1

## 2016-11-20 MED FILL — METOPROLOL SUCC ER 50 MG TA: 50 | 30 days supply | Qty: 30 | Fill #1

## 2016-11-27 MED FILL — SIMVASTATIN 20 MG TABLET: 20 | 30 days supply | Qty: 30 | Fill #2

## 2016-12-22 MED FILL — METOPROLOL SUCC ER 50 MG TA: 50 | 30 days supply | Qty: 30 | Fill #2

## 2017-01-01 MED FILL — SIMVASTATIN 20 MG TABLET: 20 | 30 days supply | Qty: 30 | Fill #3

## 2017-01-15 ENCOUNTER — Inpatient Hospital Stay (HOSPITAL_COMMUNITY)
Admission: EM | Admit: 2017-01-15 | Discharge: 2017-01-18 | DRG: 872 | Disposition: A | Discharge status: Home / Self Care | Payer: Self-pay | Attending: Internal Medicine | Admitting: Internal Medicine

## 2017-01-15 ENCOUNTER — Encounter (HOSPITAL_COMMUNITY): Payer: Self-pay | Admitting: Emergency Medicine

## 2017-01-15 DIAGNOSIS — I129 Hypertensive chronic kidney disease with stage 1 through stage 4 chronic kidney disease, or unspecified chronic kidney disease: Secondary | ICD-10-CM | POA: Diagnosis present

## 2017-01-15 DIAGNOSIS — N183 Chronic kidney disease, stage 3 (moderate): Secondary | ICD-10-CM | POA: Diagnosis present

## 2017-01-15 DIAGNOSIS — Z880 Allergy status to penicillin: Secondary | ICD-10-CM

## 2017-01-15 DIAGNOSIS — E876 Hypokalemia: Secondary | ICD-10-CM | POA: Diagnosis present

## 2017-01-15 DIAGNOSIS — I890 Lymphedema, not elsewhere classified: Secondary | ICD-10-CM | POA: Diagnosis present

## 2017-01-15 DIAGNOSIS — B356 Tinea cruris: Secondary | ICD-10-CM | POA: Diagnosis present

## 2017-01-15 DIAGNOSIS — L03116 Cellulitis of left lower limb: Secondary | ICD-10-CM

## 2017-01-15 DIAGNOSIS — D6959 Other secondary thrombocytopenia: Secondary | ICD-10-CM | POA: Diagnosis present

## 2017-01-15 DIAGNOSIS — E785 Hyperlipidemia, unspecified: Secondary | ICD-10-CM | POA: Diagnosis present

## 2017-01-15 DIAGNOSIS — R609 Edema, unspecified: Secondary | ICD-10-CM

## 2017-01-15 DIAGNOSIS — N179 Acute kidney failure, unspecified: Secondary | ICD-10-CM | POA: Diagnosis present

## 2017-01-15 DIAGNOSIS — R6520 Severe sepsis without septic shock: Secondary | ICD-10-CM | POA: Diagnosis present

## 2017-01-15 DIAGNOSIS — I82409 Acute embolism and thrombosis of unspecified deep veins of unspecified lower extremity: Secondary | ICD-10-CM

## 2017-01-15 DIAGNOSIS — A419 Sepsis, unspecified organism: Principal | ICD-10-CM

## 2017-01-15 LAB — URINALYSIS, ROUTINE W REFLEX MICROSCOPIC
BILIRUBIN URINE: NEGATIVE
GLUCOSE, UA: NEGATIVE mg/dL
KETONES UR: 5 mg/dL — AB
LEUKOCYTES UA: NEGATIVE
NITRITE: NEGATIVE
PH: 5 (ref 5.0–8.0)
Protein, ur: >=300 mg/dL — AB
Specific Gravity, Urine: 1.028 (ref 1.005–1.030)

## 2017-01-15 LAB — COMPREHENSIVE METABOLIC PANEL
ALBUMIN: 3.3 g/dL — AB (ref 3.5–5.0)
ALT: 27 U/L (ref 17–63)
AST: 27 U/L (ref 15–41)
Alkaline Phosphatase: 68 U/L (ref 38–126)
Anion gap: 14 (ref 5–15)
BUN: 21 mg/dL — AB (ref 6–20)
CHLORIDE: 100 mmol/L — AB (ref 101–111)
CO2: 19 mmol/L — ABNORMAL LOW (ref 22–32)
CREATININE: 1.82 mg/dL — AB (ref 0.61–1.24)
Calcium: 8.5 mg/dL — ABNORMAL LOW (ref 8.9–10.3)
GFR calc Af Amer: 47 mL/min — ABNORMAL LOW (ref >60)
GFR calc non Af Amer: 41 mL/min — ABNORMAL LOW (ref >60)
GLUCOSE: 135 mg/dL — AB (ref 65–99)
POTASSIUM: 3.1 mmol/L — AB (ref 3.5–5.1)
Sodium: 133 mmol/L — ABNORMAL LOW (ref 135–145)
Total Bilirubin: 1.4 mg/dL — ABNORMAL HIGH (ref 0.3–1.2)
Total Protein: 8 g/dL (ref 6.5–8.1)

## 2017-01-15 LAB — CBC WITH DIFFERENTIAL/PLATELET
BASOS ABS: 0 10*3/uL (ref 0.0–0.1)
BASOS PCT: 0 %
EOS PCT: 0 %
Eosinophils Absolute: 0 10*3/uL (ref 0.0–0.7)
HEMATOCRIT: 43.4 % (ref 39.0–52.0)
Hemoglobin: 15.4 g/dL (ref 13.0–17.0)
LYMPHS PCT: 14 %
Lymphs Abs: 2.3 10*3/uL (ref 0.7–4.0)
MCH: 32.1 pg (ref 26.0–34.0)
MCHC: 35.5 g/dL (ref 30.0–36.0)
MCV: 90.4 fL (ref 78.0–100.0)
MONO ABS: 0.6 10*3/uL (ref 0.1–1.0)
Monocytes Relative: 4 %
NEUTROS ABS: 13.4 10*3/uL — AB (ref 1.7–7.7)
Neutrophils Relative %: 82 %
PLATELETS: 147 10*3/uL — AB (ref 150–400)
RBC: 4.8 MIL/uL (ref 4.22–5.81)
RDW: 13 % (ref 11.5–15.5)
WBC: 16.3 10*3/uL — AB (ref 4.0–10.5)

## 2017-01-15 LAB — I-STAT CG4 LACTIC ACID, ED
Lactic Acid, Venous: 2.7 mmol/L (ref 0.5–1.9)
Lactic Acid, Venous: 2.62 mmol/L (ref 0.5–1.9)

## 2017-01-15 MED ORDER — SIMVASTATIN 20 MG PO TABS: 20.0000 mg | ORAL_TABLET | Freq: Every day | ORAL | Status: DC

## 2017-01-15 MED ORDER — SODIUM CHLORIDE 0.9 % IV SOLN
INTRAVENOUS | Status: AC
  Administered 2017-01-15 – 2017-01-16 (×2): via INTRAVENOUS

## 2017-01-15 MED ORDER — VANCOMYCIN HCL IN DEXTROSE 1-5 GM/200ML-% IV SOLN: 1000.0000 mg | Freq: Once | INTRAVENOUS | Status: DC

## 2017-01-15 MED ORDER — DEXTROSE 5 % IV SOLN
1.0000 g | Freq: Three times a day (TID) | INTRAVENOUS | Status: DC
  Administered 2017-01-16 – 2017-01-17 (×5): 1 g via INTRAVENOUS
  Filled 2017-01-15 (×7): qty 1

## 2017-01-15 MED ORDER — ACETAMINOPHEN 325 MG PO TABS
650.0000 mg | ORAL_TABLET | Freq: Four times a day (QID) | ORAL | Status: DC | PRN
  Filled 2017-01-15: qty 2

## 2017-01-15 MED ORDER — ENOXAPARIN SODIUM 40 MG/0.4ML ~~LOC~~ SOLN
40.0000 mg | SUBCUTANEOUS | Status: DC
  Filled 2017-01-15: qty 0.4

## 2017-01-15 MED ORDER — SODIUM CHLORIDE 0.9 % IV BOLUS (SEPSIS)
1000.0000 mL | Freq: Once | INTRAVENOUS | Status: AC
  Administered 2017-01-15: 1000 mL via INTRAVENOUS

## 2017-01-15 MED ORDER — VANCOMYCIN HCL 10 G IV SOLR
1250.0000 mg | INTRAVENOUS | Status: DC
  Administered 2017-01-17: 1250 mg via INTRAVENOUS
  Filled 2017-01-15: qty 1250

## 2017-01-15 MED ORDER — DEXTROSE 5 % IV SOLN
2.0000 g | Freq: Once | INTRAVENOUS | Status: AC
  Administered 2017-01-15: 2 g via INTRAVENOUS
  Filled 2017-01-15: qty 2

## 2017-01-15 MED ORDER — METRONIDAZOLE IN NACL 5-0.79 MG/ML-% IV SOLN
500.0000 mg | Freq: Three times a day (TID) | INTRAVENOUS | Status: DC
  Administered 2017-01-16 – 2017-01-17 (×4): 500 mg via INTRAVENOUS
  Filled 2017-01-15 (×5): qty 100

## 2017-01-15 MED ORDER — METRONIDAZOLE IN NACL 5-0.79 MG/ML-% IV SOLN
500.0000 mg | Freq: Once | INTRAVENOUS | Status: AC
  Administered 2017-01-15: 500 mg via INTRAVENOUS
  Filled 2017-01-15: qty 100

## 2017-01-15 MED ORDER — VANCOMYCIN HCL 10 G IV SOLR
2000.0000 mg | Freq: Once | INTRAVENOUS | Status: AC
  Administered 2017-01-15: 2000 mg via INTRAVENOUS
  Filled 2017-01-15: qty 2000

## 2017-01-15 MED ORDER — AMLODIPINE BESYLATE 5 MG PO TABS: 10.0000 mg | ORAL_TABLET | Freq: Every day | ORAL | Status: DC

## 2017-01-15 MED ORDER — ONDANSETRON HCL 4 MG PO TABS
4.0000 mg | ORAL_TABLET | Freq: Four times a day (QID) | ORAL | Status: DC | PRN
  Administered 2017-01-18: 4 mg via ORAL
  Filled 2017-01-15 (×2): qty 1

## 2017-01-15 MED ORDER — ONDANSETRON HCL 4 MG/2ML IJ SOLN
4.0000 mg | Freq: Four times a day (QID) | INTRAMUSCULAR | Status: DC | PRN
  Administered 2017-01-16: 4 mg via INTRAVENOUS
  Filled 2017-01-15: qty 2

## 2017-01-15 MED ORDER — METOPROLOL SUCCINATE ER 50 MG PO TB24: 50.0000 mg | ORAL_TABLET | Freq: Every day | ORAL | Status: DC

## 2017-01-15 MED ORDER — ACETAMINOPHEN 650 MG RE SUPP: 650.0000 mg | Freq: Four times a day (QID) | RECTAL | Status: DC | PRN

## 2017-01-15 NOTE — ED Triage Notes (Signed)
Patient reports worsening  left lower leg cellulitis onset Saturday this week with low grade fever . HR= 140's at triage .

## 2017-01-15 NOTE — ED Notes (Signed)
Writer notified EDP Long of abnormal I stat lactic result 

## 2017-01-15 NOTE — H&P (Addendum)
History and Physical    Brian MangoJames Wade ZOX:[id number] DOB: 08/23/1965 DOA: 01/15/2017  PCP: [name], [name], MD  Patient coming from: Home.  Chief Complaint: Fever chills.  HPI: Brian MangoJames Wade is a 51 y.o. male with with a history of hypertension hyperlipidemia chronic lymphedema of the lower extremities presents to the ER with complaints of fever chills and confusion nausea vomiting.  Patient states he noticed some swelling and redness worsening on Saturday 3 days ago which has gradually worsened.  Patient started developing fever chills nausea vomiting and a renal cell confusion.  Patient states his friend prompted him to come to the ER.  Patient has not taken his medications last 2 days.  ED Course: In the ER patient was found to be tachycardic febrile elevated lactic acid level.  On exam patient has redness and swelling of the left lower extremity.  The patient was placed on empiric antibiotics for sepsis from cellulitis.  Review of Systems: As per HPI, rest all negative.   Past Medical History:  Diagnosis Date  . Hyperlipidemia 2011  . Hypertension 2010  . Lymphedema     Past Surgical History:  Procedure Laterality Date  . NO PAST SURGERIES       reports that  has never smoked. he has never used smokeless tobacco. He reports that he does not drink alcohol or use drugs.  Allergies  Allergen Reactions  . Penicillins Hives and Swelling    Has patient had a PCN reaction causing immediate rash, facial/tongue/throat swelling, SOB or lightheadedness with hypotension: YES Has patient had a PCN reaction causing severe rash involving mucus membranes or skin necrosis: No Has patient had a PCN reaction that required hospitalization: Yes Has patient had a PCN reaction occurring within the last 10 years: No If all of the above answers are "NO", then may proceed with Cephalosporin use.    Family History  Problem Relation Age of Onset  . Heart disease Mother   . Hypertension Mother   .  Asthma Sister   . Diabetes Maternal Aunt   . Cancer Maternal Grandmother     Prior to Admission medications   Medication Sig Start Date End Date Taking? Authorizing Provider  amLODipine (NORVASC) 10 MG tablet Take 1 tablet (10 mg total) by mouth daily. Needs office visit 09/28/16  Yes Funches, [name], MD  ibuprofen (ADVIL,MOTRIN) 200 MG tablet Take 600 mg every 6 (six) hours as needed by mouth.   Yes [provider]  metoprolol succinate (TOPROL-XL) 50 MG 24 hr tablet Take 1 tablet (50 mg total) by mouth daily. Needs office visit 09/28/16  Yes Funches, Gerilyn NestleJosalyn, MD  simvastatin (ZOCOR) 20 MG tablet Take 1 tablet (20 mg total) by mouth daily at 6 PM. Needs office visit 09/28/16  Yes [name], [name], MD    Physical Exam: Vitals:   01/15/17 1945 01/15/17 2000 01/15/17 2030 01/15/17 2212  BP: (!) 125/93 114/85 112/86 123/88  Pulse: (!) 132 (!) 132 (!) 119 (!) 111  Resp: 15 14 18 17   Temp:      TempSrc:      SpO2: 97% 98% 97% 98%  Weight:      Height:          Constitutional: Moderately built and nourished. Vitals:   01/15/17 1945 01/15/17 2000 01/15/17 2030 01/15/17 2212  BP: (!) 125/93 114/85 112/86 123/88  Pulse: (!) 132 (!) 132 (!) 119 (!) 111  Resp: 15 14 18 17   Temp:      TempSrc:  SpO2: 97% 98% 97% 98%  Weight:      Height:       Eyes: Anicteric no pallor. ENMT: No discharge from the ears eyes nose and mouth. Neck: No mass felt.  No neck rigidity. Respiratory: No rhonchi or crepitations. Cardiovascular: S1-S2 heard no murmurs appreciated. Abdomen: Soft nontender bowel sounds present. Musculoskeletal: Swelling and edema of the left lower extremity with erythema. Skin: Erythema and swelling of the left lower extreme extending up to the thigh. Neurologic: Alert awake oriented to time place and person.  Moves all extremities. Psychiatric: Appears normal.  Normal affect.   Labs on Admission: I have personally reviewed following labs and imaging  studies  CBC: Recent Labs  Lab 01/15/17 1950  WBC 16.3*  NEUTROABS 13.4*  HGB 15.4  HCT 43.4  MCV 90.4  PLT 147*   Basic Metabolic Panel: Recent Labs  Lab 01/15/17 1950  NA 133*  K 3.1*  CL 100*  CO2 19*  GLUCOSE 135*  BUN 21*  CREATININE 1.82*  CALCIUM 8.5*   GFR: Estimated Creatinine Clearance: 55.6 mL/min (A) (by C-G formula based on SCr of 1.82 mg/dL (H)). Liver Function Tests: Recent Labs  Lab 01/15/17 1950  AST 27  ALT 27  ALKPHOS 68  BILITOT 1.4*  PROT 8.0  ALBUMIN 3.3*   No results for input(s): LIPASE, AMYLASE in the last 168 hours. No results for input(s): AMMONIA in the last 168 hours. Coagulation Profile: No results for input(s): INR, PROTIME in the last 168 hours. Cardiac Enzymes: No results for input(s): CKTOTAL, CKMB, CKMBINDEX, TROPONINI in the last 168 hours. BNP (last 3 results) No results for input(s): PROBNP in the last 8760 hours. HbA1C: No results for input(s): HGBA1C in the last 72 hours. CBG: No results for input(s): GLUCAP in the last 168 hours. Lipid Profile: No results for input(s): CHOL, HDL, LDLCALC, TRIG, CHOLHDL, LDLDIRECT in the last 72 hours. Thyroid Function Tests: No results for input(s): TSH, T4TOTAL, FREET4, T3FREE, THYROIDAB in the last 72 hours. Anemia Panel: No results for input(s): VITAMINB12, FOLATE, FERRITIN, TIBC, IRON, RETICCTPCT in the last 72 hours. Urine analysis:    Component Value Date/Time   COLORURINE AMBER (A) 01/15/2017 1937   APPEARANCEUR HAZY (A) 01/15/2017 1937   LABSPEC 1.028 01/15/2017 1937   PHURINE 5.0 01/15/2017 1937   GLUCOSEU NEGATIVE 01/15/2017 1937   HGBUR LARGE (A) 01/15/2017 1937   BILIRUBINUR NEGATIVE 01/15/2017 1937   KETONESUR 5 (A) 01/15/2017 1937   PROTEINUR >=300 (A) 01/15/2017 1937   UROBILINOGEN 1.0 02/17/2013 2114   NITRITE NEGATIVE 01/15/2017 1937   LEUKOCYTESUR NEGATIVE 01/15/2017 1937   Sepsis Labs: @LABRCNTIP (procalcitonin:4,lacticidven:4) )No results found for  this or any previous visit (from the past 240 hour(s)).   Radiological Exams on Admission: No results found.   Assessment/Plan Principal Problem:   Sepsis (HCC) Active Problems:   Lymphedema of left leg   Cellulitis of left lower extremity    1. Sepsis secondary to cellulitis of the left lower extremity in a patient with lymphedema -continue with aggressive hydration and on vancomycin and Azactam and Flagyl.  Patient is allergic to penicillin.  Follow cultures. 2. Hypertension -holding antihypertensives secondary to low normal blood pressure.  Closely follow blood pressure trends. 3. Hyperlipidemia on statins which can be resumed after patient more stable. 4. Thrombocytopenia likely from infectious causes.  Follow CBC. 5. Acute renal failure -likely from nausea vomiting and also low normal blood pressure and sepsis.  Follow UA.  Continue with hydration.  DVT prophylaxis: Lovenox. Code Status: Full code. Family Communication: Discussed with patient. Disposition Plan: Home. Consults called: None. Admission status: Inpatient.   Eduard Clos MD [facility] Pager [phone].  If 7PM-7AM, please contact night-coverage [email or web address] Password [email or web address]  01/15/2017, 11:12 PM

## 2017-01-15 NOTE — ED Provider Notes (Signed)
Emergency Department Provider Note   I have reviewed the triage vital signs and the nursing notes.   HISTORY  Chief Complaint Cellulitis   HPI Brian Wade is a 51 y.o. male with PMH of HTN and HLD presents to the emergency department for evaluation of worsening left lower extremity redness and swelling.  Symptoms have been worsening over the past 3 days.  He has a history of lymphedema and prior isolated swelling in the left lower extremity.  He denies any history of DVT.  He denies any skin breakdown or other ulceration.  No injury.  States that he was unable to sleep last night and had fever and sweats throughout the day.  He is noticed redness worsening into the right groin and inner thigh.  No recent antibiotics. No modifying factors.    Past Medical History:  Diagnosis Date  . Hyperlipidemia 2011  . Hypertension 2010  . Lymphedema     Patient Active Problem List   Diagnosis Date Noted  . Sepsis (HCC) 01/15/2017  . Cellulitis of left lower extremity 01/15/2017  . Gingival swelling 09/28/2016  . Lymphedema of left leg 09/28/2016  . HTN (hypertension) 02/18/2013  . HLD (hyperlipidemia) 02/18/2013    Past Surgical History:  Procedure Laterality Date  . NO PAST SURGERIES      Current Outpatient Rx  . Order #: [id number] Class: Normal  . Order #: [id number] Class: Historical Med  . Order #: [id number] Class: Normal  . Order #: [id number] Class: Normal    Allergies Penicillins  Family History  Problem Relation Age of Onset  . Heart disease Mother   . Hypertension Mother   . Asthma Sister   . Diabetes Maternal Aunt   . Cancer Maternal Grandmother     Social History Social History   Tobacco Use  . Smoking status: Never Smoker  . Smokeless tobacco: Never Used  Substance Use Topics  . Alcohol use: No  . Drug use: No    Review of Systems  Constitutional: No fever/chills Eyes: No visual changes. ENT: No sore throat. Cardiovascular: Denies chest  pain. Respiratory: Denies shortness of breath. Gastrointestinal: No abdominal pain.  No nausea, no vomiting.  No diarrhea.  No constipation. Genitourinary: Negative for dysuria. Musculoskeletal: Negative for back pain. Skin: Left leg pain, swelling, and erythema.  Neurological: Negative for headaches, focal weakness or numbness.  10-point ROS otherwise negative.  ____________________________________________   PHYSICAL EXAM:  VITAL SIGNS: ED Triage Vitals  Enc Vitals Group     BP 01/15/17 1914 (!) 128/95     Pulse Rate 01/15/17 1914 (!) 143     Resp 01/15/17 1914 18     Temp 01/15/17 1914 99.8 F (37.7 C)     Temp Source 01/15/17 1914 Oral     SpO2 01/15/17 1914 96 %     Weight 01/15/17 1915 235 lb (106.6 kg)     Height 01/15/17 1915 5\' 8"  (1.727 m)     Pain Score 01/15/17 1916 7   Constitutional: Alert and oriented. Well appearing and in no acute distress. Eyes: Conjunctivae are normal.  Head: Atraumatic. Nose: No congestion/rhinnorhea. Mouth/Throat: Mucous membranes are moist. Neck: No stridor.  Cardiovascular: Tachycardia. Good peripheral circulation. Grossly normal heart sounds.   Respiratory: Normal respiratory effort.  No retractions. Lungs CTAB. Gastrointestinal: Soft and nontender. No distention.  Musculoskeletal: No lower extremity tenderness nor edema. No gross deformities of extremities. Neurologic:  Normal speech and language. No gross focal neurologic deficits are appreciated.  Skin:  Skin is warm  and dry. Diffuse erythema over the LLE with tracing erythema up the right calf and groin. No fluctuance or induration. No ulceration.   ____________________________________________   LABS (all labs ordered are listed, but only abnormal results are displayed)  Labs Reviewed  CBC WITH DIFFERENTIAL/PLATELET - Abnormal; Notable for the following components:      Result Value   WBC 16.3 (*)    Platelets 147 (*)    Neutro Abs 13.4 (*)    All other components within  normal limits  COMPREHENSIVE METABOLIC PANEL - Abnormal; Notable for the following components:   Sodium 133 (*)    Potassium 3.1 (*)    Chloride 100 (*)    CO2 19 (*)    Glucose, Bld 135 (*)    BUN 21 (*)    Creatinine, Ser 1.82 (*)    Calcium 8.5 (*)    Albumin 3.3 (*)    Total Bilirubin 1.4 (*)    GFR calc non Af Amer 41 (*)    GFR calc Af Amer 47 (*)    All other components within normal limits  URINALYSIS, ROUTINE W REFLEX MICROSCOPIC - Abnormal; Notable for the following components:   Color, Urine AMBER (*)    APPearance HAZY (*)    Hgb urine dipstick LARGE (*)    Ketones, ur 5 (*)    Protein, ur >=300 (*)    Bacteria, UA RARE (*)    Squamous Epithelial / LPF 0-5 (*)    All other components within normal limits  I-STAT CG4 LACTIC ACID, ED - Abnormal; Notable for the following components:   Lactic Acid, Venous 2.70 (*)    All other components within normal limits  I-STAT CG4 LACTIC ACID, ED - Abnormal; Notable for the following components:   Lactic Acid, Venous 2.62 (*)    All other components within normal limits  CULTURE, BLOOD (ROUTINE X 2)  CULTURE, BLOOD (ROUTINE X 2)  URINE CULTURE  HIV ANTIBODY (ROUTINE TESTING)  BASIC METABOLIC PANEL  CBC  CBC  CREATININE, SERUM   ____________________________________________  EKG   EKG Interpretation  Date/Time:  Monday January 15 2017 20:13:48 EST Ventricular Rate:  123 PR Interval:  138 QRS Duration: 83 QT Interval:  329 QTC Calculation: 471 R Axis:   -62 Text Interpretation:  Sinus tachycardia Inferior infarct, old Consider anterior infarct No STEMI.  Confirmed by Alona BeneLong, Joshua [id number]) on 01/15/2017 8:30:16 PM      ____________________________________________   PROCEDURES  Procedure(s) performed:   Procedures  CRITICAL CARE Performed by: Maia PlanJoshua G Long Total critical care time: 35 minutes Critical care time was exclusive of separately billable procedures and treating other patients. Critical care was  necessary to treat or prevent imminent or life-threatening deterioration. Critical care was time spent personally by me on the following activities: development of treatment plan with patient and/or surrogate as well as nursing, discussions with consultants, evaluation of patient's response to treatment, examination of patient, obtaining history from patient or surrogate, ordering and performing treatments and interventions, ordering and review of laboratory studies, ordering and review of radiographic studies, pulse oximetry and re-evaluation of patient's condition.  Alona BeneJoshua Long, MD Emergency Medicine  ____________________________________________   INITIAL IMPRESSION / ASSESSMENT AND PLAN / ED COURSE  Pertinent labs & imaging results that were available during my care of the patient were reviewed by me and considered in my medical decision making (see chart for details).  History presents emergency department for evaluation of left lower leg cellulitis and developing sepsis.  He is tachycardic on arrival but normal blood pressure.  Afebrile has had fevers at home.  Erythema is tracking from the left lower extremity up the medial thigh to the groin. Lactate elevated. Following labs. Activated CODE SEPSIS and will start abx.   Discussed patient's case with Hospitalist, Dr. Toniann FailKakrakandy to request admission. Patient and family (if present) updated with plan. Care transferred to Hospitalist service.  I reviewed all nursing notes, vitals, pertinent old records, EKGs, labs, imaging (as available).  ____________________________________________  FINAL CLINICAL IMPRESSION(S) / ED DIAGNOSES  Final diagnoses:  Cellulitis of left lower extremity  Sepsis, due to unspecified organism [facility](HCC)     MEDICATIONS GIVEN DURING THIS VISIT:  Medications  vancomycin (VANCOCIN) 2,000 mg in sodium chloride 0.9 % 500 mL IVPB (2,000 mg Intravenous New Bag/Given 01/15/17 2211)  aztreonam (AZACTAM) 1 g in dextrose  5 % 50 mL IVPB (not administered)  metroNIDAZOLE (FLAGYL) IVPB 500 mg (not administered)  vancomycin (VANCOCIN) 1,250 mg in sodium chloride 0.9 % 250 mL IVPB (not administered)  metoprolol succinate (TOPROL-XL) 24 hr tablet 50 mg (not administered)  amLODipine (NORVASC) tablet 10 mg (not administered)  simvastatin (ZOCOR) tablet 20 mg (not administered)  acetaminophen (TYLENOL) tablet 650 mg (not administered)    Or  acetaminophen (TYLENOL) suppository 650 mg (not administered)  ondansetron (ZOFRAN) tablet 4 mg (not administered)    Or  ondansetron (ZOFRAN) injection 4 mg (not administered)  enoxaparin (LOVENOX) injection 40 mg (not administered)  0.9 %  sodium chloride infusion ( Intravenous New Bag/Given 01/15/17 2327)  sodium chloride 0.9 % bolus 1,000 mL (0 mLs Intravenous Stopped 01/15/17 2323)  aztreonam (AZACTAM) 2 g in dextrose 5 % 50 mL IVPB (0 g Intravenous Stopped 01/15/17 2323)  metroNIDAZOLE (FLAGYL) IVPB 500 mg (0 mg Intravenous Stopped 01/15/17 2323)    Note:  This document was prepared using Dragon voice recognition software and may include unintentional dictation errors.  Alona BeneJoshua Long, MD Emergency Medicine    Long, Arlyss RepressJoshua G, MD 01/15/17 [phone]

## 2017-01-15 NOTE — ED Notes (Signed)
Writer notified EDP Long of abnormal I stat lactic result

## 2017-01-15 NOTE — ED Notes (Signed)
Burnett CorrenteK. Ford PA notified on pt.'s heart rate/condition .

## 2017-01-15 NOTE — Progress Notes (Signed)
Pharmacy Antibiotic Note  Brian MangoJames Branham is a 51 y.o. male admitted on 01/15/2017 with worsening LLL cellulitis and low grade fever.  Pharmacy has been consulted for vancomycin, aztreonam, and metronidazole dosing.  Patient has AKI.  He is afebrile but his WBC and lactic acid are elevated.   Plan: Vanc 2gm IV x 1, then 1250mg  IV Q24H for goal trough 10-15 mcg/mL Azactam 2gm IV x 1, then 1gm IV Q8H Flagyl 500mg  IV Q8H Monitor renal fxn, clinical progress, vanc trough at Css   Height: 5\' 8"  (172.7 cm) Weight: 235 lb (106.6 kg) IBW/kg (Calculated) : 68.4  Temp (24hrs), Avg:99.8 F (37.7 C), Min:99.8 F (37.7 C), Max:99.8 F (37.7 C)  Recent Labs  Lab 01/15/17 1950 01/15/17 1954  WBC 16.3*  --   CREATININE 1.82*  --   LATICACIDVEN  --  2.70*    Estimated Creatinine Clearance: 55.6 mL/min (A) (by C-G formula based on SCr of 1.82 mg/dL (H)).    Allergies  Allergen Reactions  . Penicillins Hives and Swelling    Has patient had a PCN reaction causing immediate rash, facial/tongue/throat swelling, SOB or lightheadedness with hypotension: YES Has patient had a PCN reaction causing severe rash involving mucus membranes or skin necrosis: No Has patient had a PCN reaction that required hospitalization: Yes Has patient had a PCN reaction occurring within the last 10 years: No If all of the above answers are "NO", then may proceed with Cephalosporin use.    Vanc 11/19 >> Azactam 11/19 >> Flagyl 11/19 >>  11/19 UCx - 11/19 BCx -    Vinetta Brach D. Laney Potashang, PharmD, BCPS Pager:  [phone] 01/15/2017, 8:49 PM

## 2017-01-16 ENCOUNTER — Encounter (HOSPITAL_COMMUNITY): Payer: Self-pay | Admitting: General Practice

## 2017-01-16 ENCOUNTER — Other Ambulatory Visit: Payer: Self-pay

## 2017-01-16 DIAGNOSIS — R600 Localized edema: Secondary | ICD-10-CM

## 2017-01-16 DIAGNOSIS — I890 Lymphedema, not elsewhere classified: Secondary | ICD-10-CM

## 2017-01-16 DIAGNOSIS — N179 Acute kidney failure, unspecified: Secondary | ICD-10-CM

## 2017-01-16 LAB — BASIC METABOLIC PANEL
Anion gap: 7 (ref 5–15)
BUN: 20 mg/dL (ref 6–20)
CALCIUM: 8.1 mg/dL — AB (ref 8.9–10.3)
CHLORIDE: 107 mmol/L (ref 101–111)
CO2: 26 mmol/L (ref 22–32)
Creatinine, Ser: 1.69 mg/dL — ABNORMAL HIGH (ref 0.61–1.24)
GFR calc non Af Amer: 45 mL/min — ABNORMAL LOW (ref >60)
GFR, EST AFRICAN AMERICAN: 52 mL/min — AB (ref >60)
Glucose, Bld: 110 mg/dL — ABNORMAL HIGH (ref 65–99)
POTASSIUM: 3.6 mmol/L (ref 3.5–5.1)
SODIUM: 140 mmol/L (ref 135–145)

## 2017-01-16 LAB — LACTIC ACID, PLASMA
LACTIC ACID, VENOUS: 1.2 mmol/L (ref 0.5–1.9)
LACTIC ACID, VENOUS: 1.2 mmol/L (ref 0.5–1.9)

## 2017-01-16 LAB — CBC
HCT: 38.8 % — ABNORMAL LOW (ref 39.0–52.0)
HEMOGLOBIN: 13.2 g/dL (ref 13.0–17.0)
MCH: 31.5 pg (ref 26.0–34.0)
MCHC: 34 g/dL (ref 30.0–36.0)
MCV: 92.6 fL (ref 78.0–100.0)
Platelets: 127 10*3/uL — ABNORMAL LOW (ref 150–400)
RBC: 4.19 MIL/uL — AB (ref 4.22–5.81)
RDW: 13.5 % (ref 11.5–15.5)
WBC: 11.5 10*3/uL — AB (ref 4.0–10.5)

## 2017-01-16 LAB — HIV ANTIBODY (ROUTINE TESTING W REFLEX): HIV Screen 4th Generation wRfx: NONREACTIVE

## 2017-01-16 MED ORDER — SODIUM CHLORIDE 0.9 % IV BOLUS (SEPSIS)
500.0000 mL | Freq: Once | INTRAVENOUS | Status: AC
Start: 2017-01-16 — End: 2017-01-16
  Administered 2017-01-16: 500 mL via INTRAVENOUS

## 2017-01-16 MED ORDER — HEPARIN SODIUM (PORCINE) 5000 UNIT/ML IJ SOLN
5000.0000 [IU] | Freq: Three times a day (TID) | INTRAMUSCULAR | Status: DC
  Administered 2017-01-16 – 2017-01-18 (×5): 5000 [IU] via SUBCUTANEOUS
  Filled 2017-01-16 (×6): qty 1

## 2017-01-16 NOTE — ED Notes (Signed)
Patient was given a Cup of Ice Coke. 

## 2017-01-16 NOTE — Progress Notes (Signed)
Pt arrived to the unit at 1500 via bed.

## 2017-01-16 NOTE — Progress Notes (Addendum)
Vascular ultrasound was ordered to rule out DVT on left lower extremity per Dr. Margo AyeHall.

## 2017-01-16 NOTE — ED Notes (Signed)
[employer] has been ordered for Lunch.

## 2017-01-16 NOTE — Progress Notes (Signed)
Report received from Clydie BraunKaren in the ED.

## 2017-01-16 NOTE — Progress Notes (Signed)
PROGRESS NOTE  Adline MangoJames Kelley ZOX:[id number] DOB: 06/09/1965 DOA: 01/15/2017 PCP: [name], [name], MD  HPI/Recap of past 24 hours: [name] is a 51 year old male with pMH HTN, HLD, chronic lymphedema of left lower extremity presents to the ED with complaints of redness and tenderness of left lower extremity with onset 4 days ago. Patient reports family hx of lymphedema and DVT in his brother.  Today, patient reports symptoms are similar to admission. On broad spectrum IV antibiotics IV vanc, IV aztreonam and IV flagyl. Admits to intermittent nausea with no vomiting.   Assessment/Plan: Principal Problem:   Sepsis (HCC) Active Problems:   Lymphedema of left leg   Cellulitis of left lower extremity  1. Sepsis secondary to cellulitis of the left lower extremity in the setting of chronic lymphedema, improving        -wbc 16, RR 24 hr 107 (01/15/17)       -wbc 11.5 today 01/16/17       -IV hydration       -IV vancomycin, IV Azetreonam and IV Flagyl day #1(allergic to penicillin)       -blood cx x2 no growth in less than 24 hours  2.    Left lower extremity lymphedema        -Duplex U/S left lower extremity to r/o DVT  3.    AKI on CKD3        -cr improving 1.62 from 1.82baseline cr 1.25        -IV hydration         -avoid nephrotoxic meds  4.     Essential Hypertension        -holding antihypertensives secondary to low normal blood pressure       -Monitor BP and HR       -IV fluid  5.     Hyperlipidemia         -statin        -last LDL 117 (09/28/16)  6.     Thrombocytopenia          -likely from infectious causes         -CBC am  7.      Elevated lactic acid         -peaked at 2.70         -trending down to 1.2 from 2.62    Code Status: Full  Family Communication: None  Disposition Plan: Will stay another midnight to continue treatment.    Consultants:  None  Procedures:  None  Antimicrobials:  IV vancomycin, IV aztreonan, IV flagyl  DVT  prophylaxis:  heparin sq 5000 U TID   Objective: Vitals:   01/16/17 0700 01/16/17 0730 01/16/17 0800 01/16/17 0830  BP: 104/74 99/77 116/81 107/75  Pulse: 88 92 (!) 108 88  Resp: (!) 23 20 18  (!) 25  Temp:      TempSrc:      SpO2: 96% 95% 99% 94%  Weight:      Height:        Intake/Output Summary (Last 24 hours) at 01/16/2017 0925 Last data filed at 01/16/2017 [id number] Gross per 24 hour  Intake 1700 ml  Output -  Net 1700 ml   Filed Weights   01/15/17 1915  Weight: 106.6 kg (235 lb)    Exam:   General:  51 yo CM WD WN NAD. A&O x3  Cardiovascular: RRR no rubs or gallops  Respiratory: CTA no wheeze or rales  Abdomen: Soft NT ND NBSx 4  quadrants  Musculoskeletal: Left lower extremity twice the size of right. Erythematous, warm and tender  Skin: As noted above  Psychiatry: Mood is appropriate for condition and setting   Data Reviewed: CBC: Recent Labs  Lab 01/15/17 1950 01/16/17 0316  WBC 16.3* 11.5*  NEUTROABS 13.4*  --   HGB 15.4 13.2  HCT 43.4 38.8*  MCV 90.4 92.6  PLT 147* 127*   Basic Metabolic Panel: Recent Labs  Lab 01/15/17 1950 01/16/17 0316  NA 133* 140  K 3.1* 3.6  CL 100* 107  CO2 19* 26  GLUCOSE 135* 110*  BUN 21* 20  CREATININE 1.82* 1.69*  CALCIUM 8.5* 8.1*   GFR: Estimated Creatinine Clearance: 59.8 mL/min (A) (by C-G formula based on SCr of 1.69 mg/dL (H)). Liver Function Tests: Recent Labs  Lab 01/15/17 1950  AST 27  ALT 27  ALKPHOS 68  BILITOT 1.4*  PROT 8.0  ALBUMIN 3.3*   No results for input(s): LIPASE, AMYLASE in the last 168 hours. No results for input(s): AMMONIA in the last 168 hours. Coagulation Profile: No results for input(s): INR, PROTIME in the last 168 hours. Cardiac Enzymes: No results for input(s): CKTOTAL, CKMB, CKMBINDEX, TROPONINI in the last 168 hours. BNP (last 3 results) No results for input(s): PROBNP in the last 8760 hours. HbA1C: No results for input(s): HGBA1C in the last 72  hours. CBG: No results for input(s): GLUCAP in the last 168 hours. Lipid Profile: No results for input(s): CHOL, HDL, LDLCALC, TRIG, CHOLHDL, LDLDIRECT in the last 72 hours. Thyroid Function Tests: No results for input(s): TSH, T4TOTAL, FREET4, T3FREE, THYROIDAB in the last 72 hours. Anemia Panel: No results for input(s): VITAMINB12, FOLATE, FERRITIN, TIBC, IRON, RETICCTPCT in the last 72 hours. Urine analysis:    Component Value Date/Time   COLORURINE AMBER (A) 01/15/2017 1937   APPEARANCEUR HAZY (A) 01/15/2017 1937   LABSPEC 1.028 01/15/2017 1937   PHURINE 5.0 01/15/2017 1937   GLUCOSEU NEGATIVE 01/15/2017 1937   HGBUR LARGE (A) 01/15/2017 1937   BILIRUBINUR NEGATIVE 01/15/2017 1937   KETONESUR 5 (A) 01/15/2017 1937   PROTEINUR >=300 (A) 01/15/2017 1937   UROBILINOGEN 1.0 02/17/2013 2114   NITRITE NEGATIVE 01/15/2017 1937   LEUKOCYTESUR NEGATIVE 01/15/2017 1937   Sepsis Labs: @LABRCNTIP (procalcitonin:4,lacticidven:4)  )No results found for this or any previous visit (from the past 240 hour(s)).    Studies: No results found.  Scheduled Meds:  Continuous Infusions: . sodium chloride 125 mL/hr at 01/15/17 2327  . aztreonam Stopped (01/16/17 16100822)  . metronidazole Stopped (01/16/17 0717)  . vancomycin       LOS: 1 day     Darlin Droparole N Burnette Sautter, MD [facility] Pager [phone]  If 7PM-7AM, please contact night-coverage [email or web address] Password [email or web address] 01/16/2017, 9:25 AM

## 2017-01-16 NOTE — ED Notes (Signed)
Received pt from ED, pt alert/oriented x 4-- redness noted on left leg-- outlined with marker, dated and timed. Pt has redness to upper thigh-groin area, and lower leg. Warm to touch.

## 2017-01-16 NOTE — Progress Notes (Signed)
Brian MangoJames Olberding is a 51 y.o. male patient admitted from ED awake, alert - oriented  X 4 - no acute distress noted.  VSS - Blood pressure 116/84, pulse 89, temperature 98.1 F (36.7 C), temperature source Oral, resp. rate 18, height 5\' 7"  (1.702 m), weight 101.1 kg (222 lb 14.2 oz), SpO2 97 %.    IV in place, occlusive dsg intact without redness.  Orientation to room, and floor completed with information packet given to patient/family.  Patient declined safety video at this time.  Admission INP armband ID verified with patient/family, and in place.   SR up x 2, fall assessment complete, with patient and family able to verbalize understanding of risk associated with falls, and verbalized understanding to call nsg before up out of bed.  Call light within reach, patient able to voice, and demonstrate understanding.  Skin, clean-dry- intact without evidence of bruising, or skin tears.  Cellulitis noted on left lower extremity.  No evidence of skin break down noted on exam.     Will cont to eval and treat per MD orders.  Melvenia NeedlesIreti O Janaysia Mcleroy, RN 01/16/2017 5:36 PM

## 2017-01-17 LAB — BASIC METABOLIC PANEL
ANION GAP: 7 (ref 5–15)
BUN: 13 mg/dL (ref 6–20)
CALCIUM: 7.8 mg/dL — AB (ref 8.9–10.3)
CHLORIDE: 111 mmol/L (ref 101–111)
CO2: 22 mmol/L (ref 22–32)
CREATININE: 1.22 mg/dL (ref 0.61–1.24)
GFR calc non Af Amer: >60 mL/min (ref >60)
Glucose, Bld: 83 mg/dL (ref 65–99)
Potassium: 3 mmol/L — ABNORMAL LOW (ref 3.5–5.1)
SODIUM: 140 mmol/L (ref 135–145)

## 2017-01-17 LAB — URINE CULTURE: Culture: NO GROWTH

## 2017-01-17 LAB — CBC
HCT: 36.8 % — ABNORMAL LOW (ref 39.0–52.0)
Hemoglobin: 12.2 g/dL — ABNORMAL LOW (ref 13.0–17.0)
MCH: 31 pg (ref 26.0–34.0)
MCHC: 33.2 g/dL (ref 30.0–36.0)
MCV: 93.6 fL (ref 78.0–100.0)
Platelets: 121 10*3/uL — ABNORMAL LOW (ref 150–400)
RBC: 3.93 MIL/uL — AB (ref 4.22–5.81)
RDW: 13.5 % (ref 11.5–15.5)
WBC: 5.3 10*3/uL (ref 4.0–10.5)

## 2017-01-17 LAB — MRSA PCR SCREENING: MRSA BY PCR: NEGATIVE

## 2017-01-17 MED ORDER — CLINDAMYCIN HCL 150 MG PO CAPS: 150.0000 mg | ORAL_CAPSULE | Freq: Four times a day (QID) | ORAL | Status: DC

## 2017-01-17 MED ORDER — VANCOMYCIN HCL IN DEXTROSE 750-5 MG/150ML-% IV SOLN
750.0000 mg | Freq: Two times a day (BID) | INTRAVENOUS | Status: DC
  Administered 2017-01-17: 750 mg via INTRAVENOUS
  Filled 2017-01-17 (×2): qty 150

## 2017-01-17 MED ORDER — MICONAZOLE NITRATE 2 % EX CREA
TOPICAL_CREAM | Freq: Two times a day (BID) | CUTANEOUS | Status: DC
  Administered 2017-01-17 – 2017-01-18 (×3): via TOPICAL
  Filled 2017-01-17: qty 14

## 2017-01-17 MED ORDER — POTASSIUM CHLORIDE CRYS ER 20 MEQ PO TBCR
40.0000 meq | EXTENDED_RELEASE_TABLET | Freq: Once | ORAL | Status: AC
  Administered 2017-01-17: 40 meq via ORAL
  Filled 2017-01-17: qty 2

## 2017-01-17 MED ORDER — DIPHENHYDRAMINE HCL 25 MG PO CAPS
25.0000 mg | ORAL_CAPSULE | Freq: Four times a day (QID) | ORAL | Status: DC | PRN
  Administered 2017-01-17: 25 mg via ORAL
  Filled 2017-01-17: qty 1

## 2017-01-17 MED ORDER — CLINDAMYCIN HCL 150 MG PO CAPS
150.0000 mg | ORAL_CAPSULE | Freq: Four times a day (QID) | ORAL | Status: DC
  Administered 2017-01-18: 150 mg via ORAL
  Filled 2017-01-17 (×2): qty 1

## 2017-01-17 NOTE — Progress Notes (Signed)
Pharmacy Antibiotic Note Brian Wade is a 51 y.o. male admitted on 01/15/2017 with LLE cellulitis. Currently on Azactam, Flagyl and vancomycin.   SCr continues to improve. WBC trending down.   Plan: 1. Adjust vancomycin to 750 mg IV every 12 hours with improvement in renal fxn 2. Continue Azactam and Flagyl at current doses  3. Follow up on transition to oral antibiotics   Height: 5\' 7"  (170.2 cm) Weight: 222 lb 14.2 oz (101.1 kg) IBW/kg (Calculated) : 66.1  Temp (24hrs), Avg:98.9 F (37.2 C), Min:98.1 F (36.7 C), Max:99.9 F (37.7 C)  Recent Labs  Lab 01/15/17 1950 01/15/17 1954 01/15/17 2210 01/16/17 0316 01/16/17 0728 01/16/17 1518 01/17/17 0508  WBC 16.3*  --   --  11.5*  --   --  5.3  CREATININE 1.82*  --   --  1.69*  --   --  1.22  LATICACIDVEN  --  2.70* 2.62*  --  1.2 1.2  --     Estimated Creatinine Clearance: 79.3 mL/min (by C-G formula based on SCr of 1.22 mg/dL).    Allergies  Allergen Reactions  . Penicillins Hives and Swelling    Has patient had a PCN reaction causing immediate rash, facial/tongue/throat swelling, SOB or lightheadedness with hypotension: YES Has patient had a PCN reaction causing severe rash involving mucus membranes or skin necrosis: No Has patient had a PCN reaction that required hospitalization: Yes Has patient had a PCN reaction occurring within the last 10 years: No If all of the above answers are "NO", then may proceed with Cephalosporin use.    Antimicrobials this admission: Vancomycin 11/19 >> Azactam 11/19 >> Flagyl 11/19 >>  Microbiology results: 11/19 UCx - ngf 11/19 BCx - ngtd 11/20 MRSA PCR: negative   Thank you for allowing pharmacy to be a part of this patient's care.  Pollyann SamplesAndy Abigale Wade, PharmD, BCPS 01/17/2017, 10:52 AM

## 2017-01-17 NOTE — Progress Notes (Signed)
PROGRESS NOTE    Brian MangoJames Wade  ZOX:[id number] DOB: 04/25/1965 DOA: 01/15/2017 PCP: [name], [name], MD    Brief Narrative:  51 year old male who presented with fevers and chills.He does have a significant past medical history for hypertension, dyslipidemia and chronic lymphedema from his lower extremities. Complaint of 3 days of worsening edema and erythema on his left lower extremity, associated with fevers, chills, nausea, vomiting and confusion. On his initial physical examination blood pressure 125/93, heart rate 132, respiratory rate 18, saturation 97%. Moist mucous membranes, lungs clear to auscultation, heart S1-S2 present rhythmic, abdomen soft nontender, left lower extremity with edema and erythema. Sodium 133, potassium 3.1, chloride 100, bicarbonate 19, glucose 135, BUN 21, creatinine 1.82, calcium 8.5, white count 16.3, hemoglobin 15.4, hematocrit 43.4, platelets 147, urinalysis with greater than 300 protein, specific gravity 1.0 28, white cells 0-5. EKG, sinus rhythm, 140 bpm, left axis deviation, normal intervals. No ST changes, no T-wave changes. Poor R-wave progression.   Patient was admitted to the hospital working diagnosis of sepsis due to left lower extremity cellulitis.  Assessment & Plan:   Principal Problem:   Sepsis (HCC) Active Problems:   Lymphedema of left leg   Cellulitis of left lower extremity   1. Sepsis due to left lower extremity cellulitis. Clinically rash in improving, will continue antibiotic therapy with IV vancomycin and Aztreonam, will hold on metronidazole, add antifungal cream to groin lesion. Patient has remained afebrile, wbc at 5,3, cultures with no growth. Will plan to discharge in am on clindamycin.   2. Acute kidney injury. Renal function with serum cr down to 1,22, with k at 3,0 and serum bicarbonate at 22. Will continue k repletion with kcl and will follow on renal panel in am, avoid hypotension and nephrotoxic medications, follow vancomycin  level per pharmacy protocol.    3. Hypertension. Systolic blood pressure 107, will continue to hold amlodipine and metoprolol for now to prevent hypotension.   4. Dyslipidemia. Continue statin therapy.    DVT prophylaxis: heparin  Code Status: full Family Communication: No family at the bedside  Disposition Plan: home   Consultants:   none  Procedures:     Antimicrobials:   Aztreonam    Subjective: Patient feeling better, left lower extremity erythema is improving, pain is more controlled, no nausea or vomiting, no dyspnea or chest pain.   Objective: Vitals:   01/16/17 1400 01/16/17 1532 01/16/17 2115 01/17/17 0458  BP: 111/80 116/84 124/86 104/67  Pulse: 88 89 88 79  Resp: (!) 27 18 20 16   Temp:  98.1 F (36.7 C) 98.7 F (37.1 C) 98.8 F (37.1 C)  TempSrc:  Oral Oral Oral  SpO2: 97% 97% 97% 100%  Weight:  101.1 kg (222 lb 14.2 oz)    Height:  5\' 7"  (1.702 m)      Intake/Output Summary (Last 24 hours) at 01/17/2017 1016 Last data filed at 01/17/2017 0935 Gross per 24 hour  Intake 990 ml  Output -  Net 990 ml   Filed Weights   01/15/17 1915 01/16/17 1532  Weight: 106.6 kg (235 lb) 101.1 kg (222 lb 14.2 oz)    Examination:   General: Not in pain or dyspnea.  Neurology: Awake and alert, non focal  E ENT: no pallor, no icterus, oral mucosa moist Cardiovascular: No JVD. S1-S2 present, rhythmic, no gallops, rubs, or murmurs. ++ non pitting lower extremity edema. Pulmonary: vesicular breath sounds bilaterally, adequate air movement, no wheezing, rhonchi or rales. Gastrointestinal. Abdomen flat, no organomegaly, non  tender, no rebound or guarding Skin. Left lower extremity with non pitting edema, faint erythema at the medial and anterior region, no skin breaks, tender to palpation with no increased local temperature. Left groin with erythematous plaque, with no purulence.  Musculoskeletal: no joint deformities     Data Reviewed: I have personally  reviewed following labs and imaging studies  CBC: Recent Labs  Lab 01/15/17 1950 01/16/17 0316 01/17/17 0508  WBC 16.3* 11.5* 5.3  NEUTROABS 13.4*  --   --   HGB 15.4 13.2 12.2*  HCT 43.4 38.8* 36.8*  MCV 90.4 92.6 93.6  PLT 147* 127* 121*   Basic Metabolic Panel: Recent Labs  Lab 01/15/17 1950 01/16/17 0316 01/17/17 0508  NA 133* 140 140  K 3.1* 3.6 3.0*  CL 100* 107 111  CO2 19* 26 22  GLUCOSE 135* 110* 83  BUN 21* 20 13  CREATININE 1.82* 1.69* 1.22  CALCIUM 8.5* 8.1* 7.8*   GFR: Estimated Creatinine Clearance: 79.3 mL/min (by C-G formula based on SCr of 1.22 mg/dL). Liver Function Tests: Recent Labs  Lab 01/15/17 1950  AST 27  ALT 27  ALKPHOS 68  BILITOT 1.4*  PROT 8.0  ALBUMIN 3.3*   No results for input(s): LIPASE, AMYLASE in the last 168 hours. No results for input(s): AMMONIA in the last 168 hours. Coagulation Profile: No results for input(s): INR, PROTIME in the last 168 hours. Cardiac Enzymes: No results for input(s): CKTOTAL, CKMB, CKMBINDEX, TROPONINI in the last 168 hours. BNP (last 3 results) No results for input(s): PROBNP in the last 8760 hours. HbA1C: No results for input(s): HGBA1C in the last 72 hours. CBG: No results for input(s): GLUCAP in the last 168 hours. Lipid Profile: No results for input(s): CHOL, HDL, LDLCALC, TRIG, CHOLHDL, LDLDIRECT in the last 72 hours. Thyroid Function Tests: No results for input(s): TSH, T4TOTAL, FREET4, T3FREE, THYROIDAB in the last 72 hours. Anemia Panel: No results for input(s): VITAMINB12, FOLATE, FERRITIN, TIBC, IRON, RETICCTPCT in the last 72 hours.    Radiology Studies: I have reviewed all of the imaging during this hospital visit personally     Scheduled Meds: . heparin subcutaneous  5,000 Units Subcutaneous Q8H   Continuous Infusions: . aztreonam Stopped (01/17/17 0538)  . metronidazole Stopped (01/17/17 0746)  . vancomycin Stopped (01/17/17 0245)     LOS: 2 days         Mauricio Annett Gulaaniel Arrien, MD [facility] Pager [phone]

## 2017-01-17 NOTE — Progress Notes (Signed)
MD was notified of an allergic reaction to the vancomycin and there is possible phlebitis at IV site. Heat pack was used and then switched with an ice pack. MD ordered PO Benadryl and IV was removed.

## 2017-01-18 LAB — BASIC METABOLIC PANEL
Anion gap: 6 (ref 5–15)
BUN: 9 mg/dL (ref 6–20)
CHLORIDE: 107 mmol/L (ref 101–111)
CO2: 26 mmol/L (ref 22–32)
CREATININE: 1.2 mg/dL (ref 0.61–1.24)
Calcium: 8.3 mg/dL — ABNORMAL LOW (ref 8.9–10.3)
GFR calc non Af Amer: >60 mL/min (ref >60)
Glucose, Bld: 95 mg/dL (ref 65–99)
POTASSIUM: 3.3 mmol/L — AB (ref 3.5–5.1)
SODIUM: 139 mmol/L (ref 135–145)

## 2017-01-18 LAB — CBC WITH DIFFERENTIAL/PLATELET
Basophils Absolute: 0 10*3/uL (ref 0.0–0.1)
Basophils Relative: 1 %
EOS ABS: 0.1 10*3/uL (ref 0.0–0.7)
Eosinophils Relative: 2 %
HCT: 37.2 % — ABNORMAL LOW (ref 39.0–52.0)
HEMOGLOBIN: 12.3 g/dL — AB (ref 13.0–17.0)
LYMPHS ABS: 2.1 10*3/uL (ref 0.7–4.0)
LYMPHS PCT: 33 %
MCH: 30.8 pg (ref 26.0–34.0)
MCHC: 33.1 g/dL (ref 30.0–36.0)
MCV: 93 fL (ref 78.0–100.0)
MONOS PCT: 10 %
Monocytes Absolute: 0.6 10*3/uL (ref 0.1–1.0)
NEUTROS PCT: 54 %
Neutro Abs: 3.5 10*3/uL (ref 1.7–7.7)
Platelets: 164 10*3/uL (ref 150–400)
RBC: 4 MIL/uL — AB (ref 4.22–5.81)
RDW: 13.4 % (ref 11.5–15.5)
WBC: 6.4 10*3/uL (ref 4.0–10.5)

## 2017-01-18 MED ORDER — MICONAZOLE NITRATE 2 % EX CREA: TOPICAL_CREAM | Freq: Two times a day (BID) | CUTANEOUS | 0 refills | Status: AC

## 2017-01-18 MED ORDER — MICONAZOLE NITRATE 2 % EX CREA: TOPICAL_CREAM | Freq: Two times a day (BID) | CUTANEOUS | 0 refills | Status: DC

## 2017-01-18 MED ORDER — CLINDAMYCIN HCL 150 MG PO CAPS: 150.0000 mg | ORAL_CAPSULE | Freq: Four times a day (QID) | ORAL | 0 refills | Status: AC

## 2017-01-18 MED ORDER — ONDANSETRON HCL 4 MG PO TABS: 4.0000 mg | ORAL_TABLET | Freq: Three times a day (TID) | ORAL | 0 refills | Status: DC | PRN

## 2017-01-18 MED ORDER — ONDANSETRON HCL 4 MG PO TABS: 4.0000 mg | ORAL_TABLET | Freq: Three times a day (TID) | ORAL | 0 refills | Status: AC | PRN

## 2017-01-18 MED ORDER — CLINDAMYCIN HCL 150 MG PO CAPS: 150.0000 mg | ORAL_CAPSULE | Freq: Four times a day (QID) | ORAL | 0 refills | Status: DC

## 2017-01-18 NOTE — Progress Notes (Signed)
Adline MangoJames Cruzan to be D/C'd Home per MD order.  Discussed with the patient and all questions fully answered.  VSS, Skin clean, dry and intact without evidence of skin break down, no evidence of skin tears noted. IV catheter discontinued intact. Site without signs and symptoms of complications. Dressing and pressure applied.  An After Visit Summary was printed and given to the patient. Medications prescriptions sent to pt pharmacy electronically.  D/c education completed with patient/family including follow up instructions, medication list, d/c activities limitations if indicated, with other d/c instructions as indicated by MD - patient able to verbalize understanding, all questions fully answered.   Patient instructed to return to ED, call 911, or call MD for any changes in condition.   Patient escorted via WC, and D/C home via private auto with family friend.  Misty StanleySheila  Jujhar Everett 01/18/2017 2:23 PM

## 2017-01-18 NOTE — Discharge Summary (Addendum)
Physician Discharge Summary  Brian Wade ZOX:[id number] DOB: [birth year] DOA: 01/15/2017  PCP: Dessa Phi, MD  Admit date: 01/15/2017 Discharge date: 01/18/2017  Admitted From: Home Disposition:  Home  Recommendations for Outpatient Follow-up:  1. Follow up with PCP in 1-week 2. Patient has been placed on clindamycin for the next 7 days. 3. Had phlebitis at the site of Vancomycin infusion, doubt allergic reaction. 4.   Patient placed on topical antifungal cream for left groin for 2 weeks  Home Health: No  Equipment/Devices: no   Discharge Condition: Stable CODE STATUS: full  Diet recommendation:  Heart healthy  Brief/Interim Summary: 51 year old male who presented with fevers and chills.He does have a significant past medical history for hypertension, dyslipidemia and chronic lymphedema from his lower extremities. Complaint of 3 days of worsening edema and erythema on his left lower extremity, associated with fevers, chills, nausea, vomiting and confusion. On his initial physical examination blood pressure 125/93, heart rate 132, respiratory rate 18, saturation 97%. Moist mucous membranes, lungs clear to auscultation, heart S1-S2 present rhythmic, abdomen soft nontender, left lower extremity with edema and erythema. Sodium 133, potassium 3.1, chloride 100, bicarbonate 19, glucose 135, BUN 21, creatinine 1.82, calcium 8.5, white count 16.3, hemoglobin 15.4, hematocrit 43.4, platelets 147, urinalysis with greater than 300 protein, specific gravity 1.028, white cells 0-5. EKG, sinus rhythm, 140 bpm, left axis deviation, normal intervals. No ST changes, no T-wave changes. Poor R-wave progression.   Patient was admitted to the hospital with the working diagnosis of sepsis due to left lower extremity cellulitis.  1. Left lower extremity cellulitis. Patient was admitted to the medical ward, he was placed on broad-spectrum intravenous antibiotic therapy with vancomycin, metronidazole and  aztreonam, with good clinical response. At discharge he has been transitioned to oral clindamycin that he will complete for 7 more days. On his left groin likely he does have a skin fungal infection, will use topical miconazole for 2 weeks. Patient will follow-up with his primary care provider in 7 days. He had a local reaction to vancomycin infusion, likely phlebitis, not a true allergic reaction. Recommend caution if vancomycin given in the near future.   2. Acute kidney injury and hypokalemia. Patient received IV fluids with improvement of kidney function. He received potassium chloride supplementation. Vancomycin levels were followed by pharmacy per protocol. His discharge creatinine is 1.20, potassium 3.3, serum bicarbonate 26.   3. Hypertension. Blood pressure remained well-controlled during his hospitalization, antihypertensive agents were held, at discharge he will resume metoprolol and amlodipine.  4. Dyslipidemia. Continue statin therapy.  Discharge Diagnoses:  Principal Problem:   Sepsis (HCC) Active Problems:   Lymphedema of left leg   Cellulitis of left lower extremity    Discharge Instructions   Allergies as of 01/18/2017      Reactions   Penicillins Hives, Swelling   Has patient had a PCN reaction causing immediate rash, facial/tongue/throat swelling, SOB or lightheadedness with hypotension: YES Has patient had a PCN reaction causing severe rash involving mucus membranes or skin necrosis: No Has patient had a PCN reaction that required hospitalization: Yes Has patient had a PCN reaction occurring within the last 10 years: No If all of the above answers are "NO", then may proceed with Cephalosporin use.      Medication List    TAKE these medications   amLODipine 10 MG tablet Commonly known as:  NORVASC Take 1 tablet (10 mg total) by mouth daily. Needs office visit   clindamycin 150 MG capsule Commonly known  as:  CLEOCIN Take 1 capsule (150 mg total) by mouth every  6 (six) hours for 7 days.   ibuprofen 200 MG tablet Commonly known as:  ADVIL,MOTRIN Take 600 mg every 6 (six) hours as needed by mouth.   metoprolol succinate 50 MG 24 hr tablet Commonly known as:  TOPROL-XL Take 1 tablet (50 mg total) by mouth daily. Needs office visit   miconazole 2 % cream Commonly known as:  MICOTIN Apply topically 2 (two) times daily for 14 days. Apply to left groin   ondansetron 4 MG tablet Commonly known as:  ZOFRAN Take 1 tablet (4 mg total) by mouth every 8 (eight) hours as needed for nausea or vomiting.   simvastatin 20 MG tablet Commonly known as:  ZOCOR Take 1 tablet (20 mg total) by mouth daily at 6 PM. Needs office visit      Follow-up Information    Funches, Gerilyn Nestle, MD Follow up in 1 week(s).   Specialty:  Family Medicine         Allergies  Allergen Reactions  . Penicillins Hives and Swelling    Has patient had a PCN reaction causing immediate rash, facial/tongue/throat swelling, SOB or lightheadedness with hypotension: YES Has patient had a PCN reaction causing severe rash involving mucus membranes or skin necrosis: No Has patient had a PCN reaction that required hospitalization: Yes Has patient had a PCN reaction occurring within the last 10 years: No If all of the above answers are "NO", then may proceed with Cephalosporin use.    Consultations:     Procedures/Studies:  No results found.    Subjective: Patient feeling better, erythema improving, no chest pain or dyspnea, positive nausea but no vomiting.   Discharge Exam: Vitals:   01/17/17 2027 01/18/17 0503  BP: 115/84 123/77  Pulse: 82 80  Resp: 18 18  Temp: 98.5 F (36.9 C) 99.4 F (37.4 C)  SpO2: 97% 96%   Vitals:   01/17/17 0458 01/17/17 1341 01/17/17 2027 01/18/17 0503  BP: 104/67 107/74 115/84 123/77  Pulse: 79 88 82 80  Resp: 16 16 18 18   Temp: 98.8 F (37.1 C) 97.8 F (36.6 C) 98.5 F (36.9 C) 99.4 F (37.4 C)  TempSrc: Oral Oral Oral Oral   SpO2: 100% 95% 97% 96%  Weight:      Height:        General: Pt is alert, awake, not in acute distress E ENT: no pallor or icterus Cardiovascular: RRR, S1/S2 +, no rubs, no gallops Respiratory: CTA bilaterally, no wheezing, no rhonchi Abdominal: Soft, NT, ND, bowel sounds + Extremities: no edema, no cyanosis Skin. Left leg with non pitting edema, mild erythema.     The results of significant diagnostics from this hospitalization (including imaging, microbiology, ancillary and laboratory) are listed below for reference.     Microbiology: Recent Results (from the past 240 hour(s))  Blood culture (routine x 2)     Status: None (Preliminary result)   Collection Time: 01/15/17  7:25 PM  Result Value Ref Range Status   Specimen Description BLOOD LEFT ARM  Final   Special Requests   Final    BOTTLES DRAWN AEROBIC AND ANAEROBIC Blood Culture results may not be optimal due to an excessive volume of blood received in culture bottles   Culture NO GROWTH 2 DAYS  Final   Report Status PENDING  Incomplete  Blood culture (routine x 2)     Status: None (Preliminary result)   Collection Time: 01/15/17  7:35  PM  Result Value Ref Range Status   Specimen Description BLOOD RIGHT HAND  Final   Special Requests   Final    IN PEDIATRIC BOTTLE Blood Culture results may not be optimal due to an excessive volume of blood received in culture bottles   Culture NO GROWTH 2 DAYS  Final   Report Status PENDING  Incomplete  Urine culture     Status: None   Collection Time: 01/16/17  1:35 AM  Result Value Ref Range Status   Specimen Description URINE, CLEAN CATCH  Final   Special Requests NONE  Final   Culture NO GROWTH  Final   Report Status 01/17/2017 FINAL  Final  MRSA PCR Screening     Status: None   Collection Time: 01/16/17 11:35 PM  Result Value Ref Range Status   MRSA by PCR NEGATIVE NEGATIVE Final    Comment:        The GeneXpert MRSA Assay (FDA approved for NASAL specimens only), is one  component of a comprehensive MRSA colonization surveillance program. It is not intended to diagnose MRSA infection nor to guide or monitor treatment for MRSA infections.      Labs: BNP (last 3 results) No results for input(s): BNP in the last 8760 hours. Basic Metabolic Panel: Recent Labs  Lab 01/15/17 1950 01/16/17 0316 01/17/17 0508 01/18/17 0240  NA 133* 140 140 139  K 3.1* 3.6 3.0* 3.3*  CL 100* 107 111 107  CO2 19* 26 22 26   GLUCOSE 135* 110* 83 95  BUN 21* 20 13 9   CREATININE 1.82* 1.69* 1.22 1.20  CALCIUM 8.5* 8.1* 7.8* 8.3*   Liver Function Tests: Recent Labs  Lab 01/15/17 1950  AST 27  ALT 27  ALKPHOS 68  BILITOT 1.4*  PROT 8.0  ALBUMIN 3.3*   No results for input(s): LIPASE, AMYLASE in the last 168 hours. No results for input(s): AMMONIA in the last 168 hours. CBC: Recent Labs  Lab 01/15/17 1950 01/16/17 0316 01/17/17 0508 01/18/17 0240  WBC 16.3* 11.5* 5.3 6.4  NEUTROABS 13.4*  --   --  3.5  HGB 15.4 13.2 12.2* 12.3*  HCT 43.4 38.8* 36.8* 37.2*  MCV 90.4 92.6 93.6 93.0  PLT 147* 127* 121* 164   Cardiac Enzymes: No results for input(s): CKTOTAL, CKMB, CKMBINDEX, TROPONINI in the last 168 hours. BNP: Invalid input(s): POCBNP CBG: No results for input(s): GLUCAP in the last 168 hours. D-Dimer No results for input(s): DDIMER in the last 72 hours. Hgb A1c No results for input(s): HGBA1C in the last 72 hours. Lipid Profile No results for input(s): CHOL, HDL, LDLCALC, TRIG, CHOLHDL, LDLDIRECT in the last 72 hours. Thyroid function studies No results for input(s): TSH, T4TOTAL, T3FREE, THYROIDAB in the last 72 hours.  Invalid input(s): FREET3 Anemia work up No results for input(s): VITAMINB12, FOLATE, FERRITIN, TIBC, IRON, RETICCTPCT in the last 72 hours. Urinalysis    Component Value Date/Time   COLORURINE AMBER (A) 01/15/2017 1937   APPEARANCEUR HAZY (A) 01/15/2017 1937   LABSPEC 1.028 01/15/2017 1937   PHURINE 5.0 01/15/2017 1937    GLUCOSEU NEGATIVE 01/15/2017 1937   HGBUR LARGE (A) 01/15/2017 1937   BILIRUBINUR NEGATIVE 01/15/2017 1937   KETONESUR 5 (A) 01/15/2017 1937   PROTEINUR >=300 (A) 01/15/2017 1937   UROBILINOGEN 1.0 02/17/2013 2114   NITRITE NEGATIVE 01/15/2017 1937   LEUKOCYTESUR NEGATIVE 01/15/2017 1937   Sepsis Labs Invalid input(s): PROCALCITONIN,  WBC,  LACTICIDVEN Microbiology Recent Results (from the past 240 hour(s))  Blood culture (routine x 2)     Status: None (Preliminary result)   Collection Time: 01/15/17  7:25 PM  Result Value Ref Range Status   Specimen Description BLOOD LEFT ARM  Final   Special Requests   Final    BOTTLES DRAWN AEROBIC AND ANAEROBIC Blood Culture results may not be optimal due to an excessive volume of blood received in culture bottles   Culture NO GROWTH 2 DAYS  Final   Report Status PENDING  Incomplete  Blood culture (routine x 2)     Status: None (Preliminary result)   Collection Time: 01/15/17  7:35 PM  Result Value Ref Range Status   Specimen Description BLOOD RIGHT HAND  Final   Special Requests   Final    IN PEDIATRIC BOTTLE Blood Culture results may not be optimal due to an excessive volume of blood received in culture bottles   Culture NO GROWTH 2 DAYS  Final   Report Status PENDING  Incomplete  Urine culture     Status: None   Collection Time: 01/16/17  1:35 AM  Result Value Ref Range Status   Specimen Description URINE, CLEAN CATCH  Final   Special Requests NONE  Final   Culture NO GROWTH  Final   Report Status 01/17/2017 FINAL  Final  MRSA PCR Screening     Status: None   Collection Time: 01/16/17 11:35 PM  Result Value Ref Range Status   MRSA by PCR NEGATIVE NEGATIVE Final    Comment:        The GeneXpert MRSA Assay (FDA approved for NASAL specimens only), is one component of a comprehensive MRSA colonization surveillance program. It is not intended to diagnose MRSA infection nor to guide or monitor treatment for MRSA infections.       Time coordinating discharge: 45 minutes  SIGNED:   Coralie KeensMauricio Daniel Arrien, MD  [facility] 01/18/2017, 9:24 AM Pager [phone]  If 7PM-7AM, please contact night-coverage [email or web address] Password [email or web address]

## 2017-01-20 LAB — CULTURE, BLOOD (ROUTINE X 2)
CULTURE: NO GROWTH
Culture: NO GROWTH

## 2017-02-21 MED FILL — SIMVASTATIN 20 MG TABLET: 20 | 30 days supply | Qty: 30 | Fill #4

## 2017-02-21 MED FILL — METOPROLOL SUCC ER 50 MG TA: 50 | 30 days supply | Qty: 30 | Fill #3

## 2017-03-09 ENCOUNTER — Ambulatory Visit: Payer: Self-pay | Attending: Internal Medicine

## 2017-03-26 MED FILL — METOPROLOL SUCCINATE ER 50: 50 | 30 days supply | Qty: 30 | Fill #4

## 2017-03-26 MED FILL — SIMVASTATIN 20 MG TABLET: 20 | 30 days supply | Qty: 30 | Fill #5

## 2017-04-25 MED FILL — METOPROLOL SUCCINATE ER 50: 50 | 30 days supply | Qty: 30 | Fill #5

## 2017-04-27 MED FILL — SIMVASTATIN 20 MG TABLET: 20 | 30 days supply | Qty: 30 | Fill #6

## 2017-05-28 MED FILL — SIMVASTATIN 20 MG TABLET: 20 | 30 days supply | Qty: 30 | Fill #7

## 2017-05-28 MED FILL — METOPROLOL SUCCINATE ER 50: 50 | 30 days supply | Qty: 30 | Fill #6

## 2017-06-27 MED FILL — ?SIMVASTATIN 20MG TABS: 20 | 30 days supply | Qty: 30 | Fill #8

## 2017-06-27 MED FILL — METOPROLOL SUCCINATE ER 50: 50 | 30 days supply | Qty: 30 | Fill #7

## 2017-07-24 MED FILL — ?SIMVASTATIN 20MG TABS: 20 | 30 days supply | Qty: 30 | Fill #9

## 2017-07-24 MED FILL — METOPROLOL SUCCINATE ER 50: 50 | 30 days supply | Qty: 30 | Fill #8

## 2017-08-23 MED FILL — METOPROLOL SUCCINATE ER 50: 50 | 30 days supply | Qty: 30 | Fill #9

## 2017-08-23 MED FILL — ?SIMVASTATIN 20MG TABLETS: 20 | 30 days supply | Qty: 30 | Fill #10

## 2017-09-24 MED FILL — ?SIMVASTATIN 20MG TABLETS: 20 | 30 days supply | Qty: 30 | Fill #11

## 2017-09-24 MED FILL — METOPROLOL SUCCINATE ER 50: 50 | 30 days supply | Qty: 30 | Fill #10

## 2017-10-25 ENCOUNTER — Other Ambulatory Visit: Payer: Self-pay

## 2017-10-25 DIAGNOSIS — I10 Essential (primary) hypertension: Secondary | ICD-10-CM

## 2017-10-25 DIAGNOSIS — E7800 Pure hypercholesterolemia, unspecified: Secondary | ICD-10-CM

## 2017-10-25 NOTE — Telephone Encounter (Signed)
May give 30 day supplies of the requested medications

## 2017-10-26 MED ORDER — SIMVASTATIN 20 MG PO TABS: 20.0000 mg | ORAL_TABLET | Freq: Every day | ORAL | 0 refills | Status: DC

## 2017-10-26 MED ORDER — METOPROLOL SUCCINATE ER 50 MG PO TB24: 50.0000 mg | ORAL_TABLET | Freq: Every day | ORAL | 0 refills | Status: DC

## 2017-11-02 MED FILL — METOPROLOL SUCCINATE ER 50: 50 | 30 days supply | Qty: 30 | Fill #0

## 2017-11-02 MED FILL — SIMVASTATIN 20 MG TABLET: 20 | 30 days supply | Qty: 30 | Fill #0

## 2017-11-07 ENCOUNTER — Encounter: Payer: Self-pay | Admitting: Family Medicine

## 2017-11-07 ENCOUNTER — Ambulatory Visit: Payer: Self-pay | Attending: Family Medicine | Admitting: Family Medicine

## 2017-11-07 ENCOUNTER — Other Ambulatory Visit: Payer: Self-pay

## 2017-11-07 VITALS — BP 130/83 | HR 75 | Temp 98.5°F | Resp 18 | Ht 67.0 in | Wt 225.4 lb

## 2017-11-07 DIAGNOSIS — Z09 Encounter for follow-up examination after completed treatment for conditions other than malignant neoplasm: Secondary | ICD-10-CM | POA: Insufficient documentation

## 2017-11-07 DIAGNOSIS — R200 Anesthesia of skin: Secondary | ICD-10-CM | POA: Insufficient documentation

## 2017-11-07 DIAGNOSIS — R202 Paresthesia of skin: Secondary | ICD-10-CM

## 2017-11-07 DIAGNOSIS — Z880 Allergy status to penicillin: Secondary | ICD-10-CM | POA: Insufficient documentation

## 2017-11-07 DIAGNOSIS — Z1211 Encounter for screening for malignant neoplasm of colon: Secondary | ICD-10-CM

## 2017-11-07 DIAGNOSIS — I10 Essential (primary) hypertension: Secondary | ICD-10-CM

## 2017-11-07 DIAGNOSIS — E785 Hyperlipidemia, unspecified: Secondary | ICD-10-CM

## 2017-11-07 DIAGNOSIS — Z8249 Family history of ischemic heart disease and other diseases of the circulatory system: Secondary | ICD-10-CM | POA: Insufficient documentation

## 2017-11-07 DIAGNOSIS — I890 Lymphedema, not elsewhere classified: Secondary | ICD-10-CM

## 2017-11-07 DIAGNOSIS — Z800 Family history of malignant neoplasm of digestive organs: Secondary | ICD-10-CM | POA: Insufficient documentation

## 2017-11-07 DIAGNOSIS — Z79899 Other long term (current) drug therapy: Secondary | ICD-10-CM

## 2017-11-07 MED ORDER — AMLODIPINE BESYLATE 10 MG PO TABS: 10.0000 mg | ORAL_TABLET | Freq: Every day | ORAL | 6 refills | Status: DC

## 2017-11-07 MED ORDER — SIMVASTATIN 20 MG PO TABS: 20.0000 mg | ORAL_TABLET | Freq: Every day | ORAL | 6 refills | Status: DC

## 2017-11-07 MED ORDER — METOPROLOL SUCCINATE ER 50 MG PO TB24: 50.0000 mg | ORAL_TABLET | Freq: Every day | ORAL | 6 refills | Status: DC

## 2017-11-07 NOTE — Progress Notes (Signed)
Flu declined Patient stated that he takes a fluid pill but doesn't know dosage.  Patient wanted an A1c, and labs for todays visit. Patient complained of left hand thumb and index finger as well as middle tingling and numbness, at times.  [facility] is primary pharmacy

## 2017-11-07 NOTE — Progress Notes (Signed)
Subjective:    Patient ID: Brian Wade, male    DOB: 1966-02-17, 52 y.o.   MRN: [id number]  HPI 52 year old male with history of hypertension, hyperlipidemia and chronic lymphedema of the lower extremities who presents for continued medical management of his chronic medical issues.  Patient was last seen in office on 09/28/2016.  Since his last visit, patient has had hospital admission on 01/15/2017-01/18/17 due to left lower extremity cellulitis.  During the admission, patient also had acute kidney injury (Cr of 1.69) and hypokalemia (K of 3.0).  Kidney function improved with IV hydration (discharge Cr of 1.22) and patient had potassium supplementation.      Patient presents today for continued follow-up and patient states that he was brother who is 4 years younger than he has was recently diagnosed with colon cancer.  Patient has had no prior colonoscopy and would like to be referred for screening colonoscopy.  Patient states that he has been taking his blood pressure medication daily and has had no headaches or dizziness related to his blood pressure.  Patient continues to have chronic issues with left lower extremity lymphedema.  Patient states that he does take a fluid pill every other day to help with the swelling.  Patient also continues to take his simvastatin.  Patient has noticed that he has had some occasional numbness and tingling in his feet as well as some numbness and tingling in the left little finger along with the next 2 adjacent fingers off and on.  Patient states that he does currently drink 2 to 3 L of Pepsi daily.  Patient wonders if he might be prediabetic as he also has a younger sister with prediabetes.  Patient denies any use of tobacco or alcohol.  Patient is currently unemployed.  Patient has had no prior surgeries.  Patient is fasting at today's visit. Past Medical History:  Diagnosis Date  . Hyperlipidemia 2011  . Hypertension 2010  . Lymphedema    "qone on my mom's side have  had problems w/lymphedema; mine started in 2005"   Past Surgical History:  Procedure Laterality Date  . NO PAST SURGERIES     Family History  Problem Relation Age of Onset  . Heart disease Mother   . Hypertension Mother   . Alzheimer's disease Mother   . Colon polyps Mother   . Asthma Sister   . Diabetes Maternal Aunt   . Cancer Maternal Grandmother   . Colon cancer Brother    Allergies  Allergen Reactions  . Penicillins Hives and Swelling    Has patient had a PCN reaction causing immediate rash, facial/tongue/throat swelling, SOB or lightheadedness with hypotension: YES Has patient had a PCN reaction causing severe rash involving mucus membranes or skin necrosis: No Has patient had a PCN reaction that required hospitalization: Yes Has patient had a PCN reaction occurring within the last 10 years: No If all of the above answers are "NO", then may proceed with Cephalosporin use.     Review of Systems  Constitutional: Positive for fatigue and fever. Negative for chills.  Respiratory: Negative for cough and shortness of breath.   Cardiovascular: Positive for leg swelling (chronic left LE lymphedema). Negative for chest pain and palpitations.  Endocrine: Negative for polydipsia, polyphagia and polyuria.  Genitourinary: Negative for dysuria and frequency.  Neurological: Negative for dizziness and headaches.       Objective:   Physical Exam  BP 130/83   Pulse 75   Temp 98.5 F (36.9 C) (Oral)  Resp 18   Ht 5\' 7"  (1.702 m)   Wt 225 lb 6.4 oz (102.2 kg)   SpO2 97%   BMI 35.30 kg/m Vital signs and nurse's note reviewed General-well-nourished, well-developed obese male in no acute distress Neck-supple, no lymphadenopathy, no thyromegaly, carotid bruit Lungs-clear to auscultation bilaterally. Cardiovascular-regular rate and rhythm Abdomen-truncal obesity, soft and nontender Back-no CVA tenderness Extremities- patient with nonpitting generalized edema of the left lower  extremity with increase in left lower extremity size as compared to the right Neuro- patient monofilament exam on both feet; patient with positive Tinel sign of the left wrist       Assessment & Plan:  1. Essential hypertension BP is controlled on his current medication which he will continue, refills provided - Comprehensive metabolic panel - amLODipine (NORVASC) 10 MG tablet; Take 1 tablet (10 mg total) by mouth daily.  Dispense: 30 tablet; Refill: 6 - metoprolol succinate (TOPROL-XL) 50 MG 24 hr tablet; Take 1 tablet (50 mg total) by mouth daily.  Dispense: 30 tablet; Refill: 6  2. Lymphedema of left leg Patient with chronic lymphedema, for which he takes an every other day fluid pill, HCTZ. Patient states that he has not been wearing compressive hose as they are difficult to take on and off. Suggested use of compressive wraps which he has used in the past.  3. Dyslipidemia with high LDL and low HDL Patient with a history of dyslipidemia and will have lipid panel at today's visit but based on his prior labs, he will likely benefit from statin medication. Simvastatin refilled.  - Comprehensive metabolic panel - Lipid panel  4. Encounter for long-term (current) use of medications CMP will be done in follow-up of his chronic medication use - Comprehensive metabolic panel  5. Paresthesias Patient with complaint of recent onset of numbness and tingling in his feet and left little finger. Will check Hgb A1c to see if patient might have pre-diabetes or diabetes as a source of his paresthesias.  Patient also with positive tunnel of the left wrist and may have some carpal tunnel syndrome as the cause of his hand numbness.  Patient may take nonsteroidal anti-inflammatories as needed but should call or return if the numbness becomes persistent or if he has any other concerns - HgB A1c  6. Screening for colon cancer Patient will be referred to GI for screening colonoscopy - Ambulatory referral  to Gastroenterology  An After Visit Summary was printed and given to the patient.  Return in about 6 months (around 05/08/2018) for HTN/lipids and as needed.  Addendum: It does not appear that the ordered Hgb A1c was done at today's visit therefore the order will be removed.  Patient will be contacted to see if he wishes to return to have a repeat hemoglobin A1c

## 2017-11-08 LAB — COMPREHENSIVE METABOLIC PANEL WITH GFR
ALT: 26 IU/L (ref 0–44)
AST: 21 IU/L (ref 0–40)
Albumin/Globulin Ratio: 1.3 (ref 1.2–2.2)
Albumin: 4.5 g/dL (ref 3.5–5.5)
Alkaline Phosphatase: 62 IU/L (ref 39–117)
BUN/Creatinine Ratio: 15 (ref 9–20)
BUN: 17 mg/dL (ref 6–24)
Bilirubin Total: 0.7 mg/dL (ref 0.0–1.2)
CO2: 22 mmol/L (ref 20–29)
Calcium: 10 mg/dL (ref 8.7–10.2)
Chloride: 97 mmol/L (ref 96–106)
Creatinine, Ser: 1.1 mg/dL (ref 0.76–1.27)
GFR calc Af Amer: 88 mL/min/1.73
GFR calc non Af Amer: 76 mL/min/1.73
Globulin, Total: 3.6 g/dL (ref 1.5–4.5)
Glucose: 96 mg/dL (ref 65–99)
Potassium: 3.7 mmol/L (ref 3.5–5.2)
Sodium: 138 mmol/L (ref 134–144)
Total Protein: 8.1 g/dL (ref 6.0–8.5)

## 2017-11-08 LAB — LIPID PANEL
Chol/HDL Ratio: 5.6 ratio — ABNORMAL HIGH (ref 0.0–5.0)
Cholesterol, Total: 163 mg/dL (ref 100–199)
HDL: 29 mg/dL — ABNORMAL LOW
LDL Calculated: 87 mg/dL (ref 0–99)
Triglycerides: 235 mg/dL — ABNORMAL HIGH (ref 0–149)
VLDL Cholesterol Cal: 47 mg/dL — ABNORMAL HIGH (ref 5–40)

## 2017-11-10 MED ORDER — HYDROCHLOROTHIAZIDE 25 MG PO TABS: 25.0000 mg | ORAL_TABLET | Freq: Every day | ORAL | 6 refills | Status: DC

## 2017-11-12 MED FILL — HYDROCHLOROTHIAZIDE 25 MG T: 25 | 30 days supply | Qty: 30 | Fill #0

## 2017-11-26 ENCOUNTER — Telehealth: Payer: Self-pay | Admitting: *Deleted

## 2017-11-26 NOTE — Telephone Encounter (Signed)
-----   Message from Cain Saupe, MD sent at 11/08/2017 12:03 PM EDT ----- Notify patient that his CMP was normal but lipid panel shows low HDL/good cholesterol and high TG at 235. Low fat diet recommended but may also benefit form an otc fish oil supplement

## 2017-11-26 NOTE — Telephone Encounter (Signed)
Medical Assistant left message on patient's home and cell voicemail. Voicemail states to give a call back to [place] with [facility] at [phone]. !!!Please inform patient of electrolytes being normal but cholesterol being elevated. Patient should take OTC fish oil and limit fat intake. A recheck will be completed at follow up!!!

## 2017-12-03 ENCOUNTER — Other Ambulatory Visit: Payer: Self-pay | Admitting: Family Medicine

## 2017-12-03 DIAGNOSIS — E785 Hyperlipidemia, unspecified: Secondary | ICD-10-CM

## 2017-12-03 DIAGNOSIS — I10 Essential (primary) hypertension: Secondary | ICD-10-CM

## 2017-12-04 MED FILL — SIMVASTATIN 20 MG TABLET: 20 | 30 days supply | Qty: 30 | Fill #0

## 2017-12-07 MED FILL — METOPROLOL SUCCINATE ER 50: 50 | 30 days supply | Qty: 30 | Fill #0

## 2017-12-07 MED FILL — AMLODIPINE BESYLATE 10 MG T: 10 | 30 days supply | Qty: 30 | Fill #0

## 2018-01-01 MED FILL — METOPROLOL SUCCINATE ER 50: 50 | 30 days supply | Qty: 30 | Fill #1

## 2018-01-01 MED FILL — AMLODIPINE BESYLATE 10 MG T: 10 | 30 days supply | Qty: 30 | Fill #1

## 2018-01-01 MED FILL — SIMVASTATIN 20 MG TABLET: 20 | 30 days supply | Qty: 30 | Fill #1

## 2018-01-30 MED FILL — AMLODIPINE BESYLATE 10 MG T: 10 | 30 days supply | Qty: 30 | Fill #2

## 2018-01-30 MED FILL — METOPROLOL SUCCINATE ER 50: 50 | 30 days supply | Qty: 30 | Fill #2

## 2018-01-30 MED FILL — SIMVASTATIN 20 MG TABLET: 20 | 30 days supply | Qty: 30 | Fill #2

## 2018-02-28 MED FILL — SIMVASTATIN 20 MG TABLET: 20 | 30 days supply | Qty: 30 | Fill #0

## 2018-02-28 MED FILL — METOPROLOL SUCCINATE ER 50: 50 | 30 days supply | Qty: 30 | Fill #3

## 2018-02-28 MED FILL — AMLODIPINE BESYLATE 10 MG T: 10 | 30 days supply | Qty: 30 | Fill #3

## 2018-04-02 MED FILL — AMLODIPINE BESYLATE 10 MG T: 10 | 30 days supply | Qty: 30 | Fill #4

## 2018-04-02 MED FILL — SIMVASTATIN 20 MG TABLET: 20 | 30 days supply | Qty: 30 | Fill #1

## 2018-04-02 MED FILL — METOPROLOL SUCCINATE ER 50: 50 | 30 days supply | Qty: 30 | Fill #4

## 2018-04-30 MED FILL — METOPROLOL SUCCINATE ER 50: 50 | 30 days supply | Qty: 30 | Fill #5

## 2018-04-30 MED FILL — AMLODIPINE BESYLATE 10 MG T: 10 | 30 days supply | Qty: 30 | Fill #5

## 2018-04-30 MED FILL — SIMVASTATIN 20 MG TABLET: 20 | 30 days supply | Qty: 30 | Fill #2

## 2018-05-29 ENCOUNTER — Ambulatory Visit: Payer: Self-pay | Attending: Family Medicine | Admitting: Family Medicine

## 2018-05-29 ENCOUNTER — Other Ambulatory Visit: Payer: Self-pay

## 2018-06-03 MED FILL — AMLODIPINE BESYLATE 10 MG T: 10 | 30 days supply | Qty: 30 | Fill #6

## 2018-06-03 MED FILL — METOPROLOL SUCCINATE ER 50: 50 | 30 days supply | Qty: 30 | Fill #6

## 2018-06-03 MED FILL — SIMVASTATIN 20 MG TABLET: 20 | 30 days supply | Qty: 30 | Fill #3

## 2018-06-26 ENCOUNTER — Other Ambulatory Visit: Payer: Self-pay | Admitting: Family Medicine

## 2018-06-26 DIAGNOSIS — I10 Essential (primary) hypertension: Secondary | ICD-10-CM

## 2018-06-26 MED FILL — METOPROLOL SUCCINATE ER 50: 50 | 30 days supply | Qty: 30 | Fill #0

## 2018-06-26 MED FILL — SIMVASTATIN 20 MG TABLET: 20 | 30 days supply | Qty: 30 | Fill #4

## 2018-06-27 MED FILL — AMLODIPINE BESYLATE 10 MG T: 10 | 30 days supply | Qty: 30 | Fill #0

## 2018-07-25 ENCOUNTER — Other Ambulatory Visit: Payer: Self-pay | Admitting: Family Medicine

## 2018-07-25 ENCOUNTER — Other Ambulatory Visit: Payer: Self-pay | Admitting: Pharmacist

## 2018-07-25 DIAGNOSIS — I10 Essential (primary) hypertension: Secondary | ICD-10-CM

## 2018-07-25 MED ORDER — AMLODIPINE BESYLATE 10 MG PO TABS: ORAL_TABLET | ORAL | 0 refills | Status: DC

## 2018-07-25 MED FILL — ?AMLODIPINE BESYLATE 10 MG: 10 | 30 days supply | Qty: 30 | Fill #0

## 2018-07-25 MED FILL — ?SIMVASTATIN 20MG TABLE: 20 | 30 days supply | Qty: 30 | Fill #5

## 2018-07-25 MED FILL — ?METOPROLOL SUCC ER 50MG TA: 50 | 30 days supply | Qty: 30 | Fill #1

## 2018-08-22 ENCOUNTER — Telehealth (HOSPITAL_BASED_OUTPATIENT_CLINIC_OR_DEPARTMENT_OTHER): Payer: Self-pay | Admitting: Family Medicine

## 2018-08-22 ENCOUNTER — Encounter: Payer: Self-pay | Admitting: Family Medicine

## 2018-08-22 DIAGNOSIS — E785 Hyperlipidemia, unspecified: Secondary | ICD-10-CM

## 2018-08-22 DIAGNOSIS — I890 Lymphedema, not elsewhere classified: Secondary | ICD-10-CM

## 2018-08-22 DIAGNOSIS — I10 Essential (primary) hypertension: Secondary | ICD-10-CM

## 2018-08-22 MED ORDER — AMLODIPINE BESYLATE 10 MG PO TABS: ORAL_TABLET | ORAL | 1 refills | Status: DC

## 2018-08-22 MED ORDER — METOPROLOL SUCCINATE ER 50 MG PO TB24: 50.0000 mg | ORAL_TABLET | Freq: Every day | ORAL | 1 refills | Status: DC

## 2018-08-22 MED ORDER — HYDROCHLOROTHIAZIDE 25 MG PO TABS: 25.0000 mg | ORAL_TABLET | Freq: Every day | ORAL | 1 refills | Status: DC

## 2018-08-22 MED ORDER — SIMVASTATIN 20 MG PO TABS: ORAL_TABLET | ORAL | 1 refills | Status: DC

## 2018-08-22 MED FILL — ?METOPROLOL SUCC ER 50MG TA: 50 | 30 days supply | Qty: 30 | Fill #0

## 2018-08-22 MED FILL — HYDROCHLOROTHIAZIDE 25 MG T: 25 | 30 days supply | Qty: 30 | Fill #0

## 2018-08-22 MED FILL — ?AMLODIPINE BESYLATE 10 MG: 10 | 30 days supply | Qty: 30 | Fill #0

## 2018-08-22 NOTE — Progress Notes (Signed)
Virtual Visit via Video Note  I connected with Brian Wade on 08/22/18 at  4:10 PM EDT by a video enabled telemedicine application and verified that I am speaking with the correct person using two identifiers.  Location: Patient: Home Provider: Office    I discussed the limitations of evaluation and management by telemedicine and the availability of in person appointments. The patient expressed understanding and agreed to proceed.  History of Present Illness:      53 year old male being seen in follow-up of hypertension.  He also has a history of lymphedema of the left leg as well as dyslipidemia.  Patient reports that he has mostly stayed in his home secondary to the current COVID-19 pandemic.  He reports however that he now needs refills of his medications.  He has not been monitoring his blood pressure but denies any headaches or dizziness related to his blood pressure and he reports that he has been compliant with use of blood pressure medications.  He feels that the swelling in his left leg is stable.  He denies any current leg pain.  He has had no chest pain or palpitations.  No shortness of breath or cough.  He denies any abdominal pain-no nausea or vomiting.  He is seen no blood in the stools or black stools.  He denies dysuria.  No increased urinary frequency.  He continues to take his cholesterol medication and does not feel as if he is having any increased muscle aches related to the use of the medication.  Patient Active Problem List   Diagnosis Date Noted  . Sepsis (HCC) 01/15/2017  . Cellulitis of left lower extremity 01/15/2017  . Gingival swelling 09/28/2016  . Lymphedema of left leg 09/28/2016  . HTN (hypertension) 02/18/2013  . HLD (hyperlipidemia) 02/18/2013     Current Outpatient Medications on File Prior to Visit  Medication Sig Dispense Refill  . ibuprofen (ADVIL,MOTRIN) 200 MG tablet Take 600 mg every 6 (six) hours as needed by mouth.    . simvastatin (ZOCOR) 20 MG  tablet TAKE 1 TABLET BY MOUTH ONCE DAILY AT 6PM 30 tablet 2   No current facility-administered medications on file prior to visit.     Allergies  Allergen Reactions  . Penicillins Hives and Swelling    Has patient had a PCN reaction causing immediate rash, facial/tongue/throat swelling, SOB or lightheadedness with hypotension: YES Has patient had a PCN reaction causing severe rash involving mucus membranes or skin necrosis: No Has patient had a PCN reaction that required hospitalization: Yes Has patient had a PCN reaction occurring within the last 10 years: No If all of the above answers are "NO", then may proceed with Cephalosporin use.    Social History   Tobacco Use  . Smoking status: Never Smoker  . Smokeless tobacco: Never Used  Substance Use Topics  . Alcohol use: No  . Drug use: No     Family History  Problem Relation Age of Onset  . Heart disease Mother   . Hypertension Mother   . Alzheimer's disease Mother   . Colon polyps Mother   . Asthma Sister   . Diabetes Maternal Aunt   . Cancer Maternal Grandmother   . Colon cancer Brother     Past Surgical History:  Procedure Laterality Date  . NO PAST SURGERIES       Observations/Objective: No vital signs or physical examination done secondary to visit being contacted by video. Patient appeared to be in good health.  He was  in no acute distress.  Patient did not have any difficulty with breathing or speech.  No increased work of breathing and no increased rate of respiration.  Mood appears to be normal.  Assessment and Plan: 1. Essential hypertension He feels that his blood pressure stable at this time.  Refill sent to pharmacy for amlodipine and metoprolol.  Continue DASH diet and periodic monitoring of blood pressure encouraged - amLODipine (NORVASC) 10 MG tablet; Take 1 tablet by mouth daily to control blood pressure  Dispense: 90 tablet; Refill: 1 - metoprolol succinate (TOPROL-XL) 50 MG 24 hr tablet; Take 1  tablet (50 mg total) by mouth daily. Take with or immediately following a meal.  Dispense: 90 tablet; Refill: 1  2. Lymphedema of left leg Refill of hydrochlorothiazide for left leg edema/lymphedema which patient reports is currently stable - hydrochlorothiazide (HYDRODIURIL) 25 MG tablet; Take 1 tablet (25 mg total) by mouth daily. As needed for lymphedema/swelling  Dispense: 90 tablet; Refill: 1  3. Dyslipidemia with high LDL and low HDL Refill of simvastatin 20 mg for continued treatment of dyslipidemia.  Continue healthy, low-fat diet  Follow Up Instructions:Return in about 5 months (around 01/22/2019) for Hypertension/chronic issues-4 to 12-month follow-up and as needed.    I discussed the assessment and treatment plan with the patient. The patient was provided an opportunity to ask questions and all were answered. The patient agreed with the plan and demonstrated an understanding of the instructions.   The patient was advised to call back or seek an in-person evaluation if the symptoms worsen or if the condition fails to improve as anticipated.  I provided 7  minutes of non-face-to-face time during this encounter.   Antony Blackbird, MD

## 2018-08-23 MED FILL — ?SIMVASTATIN 20MG TABLE: 20 | 30 days supply | Qty: 30 | Fill #0

## 2018-09-26 MED FILL — ?METOPROLOL SUCC ER 50MG TA: 50 | 30 days supply | Qty: 30 | Fill #1

## 2018-09-26 MED FILL — ?AMLODIPINE BESYLATE 10 MG: 10 | 30 days supply | Qty: 30 | Fill #1

## 2018-09-26 MED FILL — ?SIMVASTATIN 20MG TABLE: 20 | 30 days supply | Qty: 30 | Fill #1

## 2018-10-28 MED FILL — ?SIMVASTATIN 20MG TABLE: 20 | 30 days supply | Qty: 30 | Fill #2

## 2018-10-28 MED FILL — ?METOPROLOL SUCC ER 50MG TA: 50 | 30 days supply | Qty: 30 | Fill #2

## 2018-10-28 MED FILL — ?AMLODIPINE BESYLATE 10 MG: 10 | 30 days supply | Qty: 30 | Fill #2

## 2018-11-26 MED FILL — ?METOPROLOL SUCC ER 50MG TA: 50 | 30 days supply | Qty: 30 | Fill #3

## 2018-11-26 MED FILL — ?SIMVASTATIN 20 MG TABLETS: 20 | 30 days supply | Qty: 30 | Fill #3

## 2018-11-26 MED FILL — ?AMLODIPINE BESYLATE 10 MG: 10 | 30 days supply | Qty: 30 | Fill #3

## 2018-12-26 MED FILL — ?METOPROLOL SUCC ER 50MG TA: 50 | 30 days supply | Qty: 30 | Fill #4

## 2018-12-26 MED FILL — ?AMLODIPINE BESYLATE 10 MG: 10 | 30 days supply | Qty: 30 | Fill #4

## 2018-12-26 MED FILL — ?SIMVASTATIN 20 MG TABLETS: 20 | 30 days supply | Qty: 30 | Fill #4

## 2019-01-27 MED FILL — ?AMLODIPINE BESYLATE 10 MG: 10 | 30 days supply | Qty: 30 | Fill #5

## 2019-01-27 MED FILL — ?SIMVASTATIN 20 MG TABLETS: 20 | 30 days supply | Qty: 30 | Fill #5

## 2019-01-27 MED FILL — ?METOPROLOL SUCC ER 50MG TA: 50 | 30 days supply | Qty: 30 | Fill #5

## 2019-02-24 ENCOUNTER — Other Ambulatory Visit: Payer: Self-pay | Admitting: Family Medicine

## 2019-02-24 DIAGNOSIS — I10 Essential (primary) hypertension: Secondary | ICD-10-CM

## 2019-02-24 DIAGNOSIS — E785 Hyperlipidemia, unspecified: Secondary | ICD-10-CM

## 2019-03-05 ENCOUNTER — Other Ambulatory Visit: Payer: Self-pay

## 2019-03-05 ENCOUNTER — Ambulatory Visit: Payer: Self-pay | Attending: Family Medicine | Admitting: Physician Assistant

## 2019-03-05 VITALS — BP 156/92 | HR 98 | Temp 98.7°F | Ht 67.0 in | Wt 241.0 lb

## 2019-03-05 DIAGNOSIS — E782 Mixed hyperlipidemia: Secondary | ICD-10-CM

## 2019-03-05 DIAGNOSIS — E785 Hyperlipidemia, unspecified: Secondary | ICD-10-CM

## 2019-03-05 DIAGNOSIS — I10 Essential (primary) hypertension: Secondary | ICD-10-CM

## 2019-03-05 DIAGNOSIS — Z131 Encounter for screening for diabetes mellitus: Secondary | ICD-10-CM

## 2019-03-05 DIAGNOSIS — Z125 Encounter for screening for malignant neoplasm of prostate: Secondary | ICD-10-CM

## 2019-03-05 DIAGNOSIS — I890 Lymphedema, not elsewhere classified: Secondary | ICD-10-CM

## 2019-03-05 MED ORDER — AMLODIPINE BESYLATE 10 MG PO TABS: ORAL_TABLET | ORAL | 1 refills | Status: DC

## 2019-03-05 MED ORDER — SIMVASTATIN 20 MG PO TABS: ORAL_TABLET | ORAL | 1 refills | Status: DC

## 2019-03-05 MED ORDER — METOPROLOL SUCCINATE ER 50 MG PO TB24: 50.0000 mg | ORAL_TABLET | Freq: Every day | ORAL | 1 refills | Status: DC

## 2019-03-05 MED ORDER — HYDROCHLOROTHIAZIDE 25 MG PO TABS: 25.0000 mg | ORAL_TABLET | Freq: Every day | ORAL | 1 refills | Status: DC

## 2019-03-05 MED FILL — ?METOPROLOL SUCC ER 50MG TA: 50 | 30 days supply | Qty: 30 | Fill #0

## 2019-03-05 MED FILL — ?HYDROCHLOROTHIAZIDE 25MG T: 25 | 30 days supply | Qty: 30 | Fill #0

## 2019-03-05 MED FILL — ?SIMVASTATIN 20 MG TABLETS: 20 | 30 days supply | Qty: 30 | Fill #0

## 2019-03-05 MED FILL — ?AMLODIPINE BESYLATE 10 MG: 10 | 30 days supply | Qty: 30 | Fill #0

## 2019-03-05 NOTE — Progress Notes (Signed)
Patient ID: Brian Wade, male   DOB: [birth year], 54 y.o.   MRN: [id number]   Brian Wade, is a 54 y.o. male  GEX:[id number]  [id number]:[id number]  DOB - 06-26-1965  Subjective:  Chief Complaint and HPI: Brian Wade is a 54 y.o. male here today for med RF.  Does not want flu shot.  Denies CP/SOB/HA.  Out of meds X 5 days.  Admits to poor water intake and unhealthy diet.   ROS:   Constitutional:  No f/c, No night sweats, No unexplained weight loss. EENT:  No vision changes, No blurry vision, No hearing changes. No mouth, throat, or ear problems.  Respiratory: No cough, No SOB Cardiac: No CP, no palpitations GI:  No abd pain, No N/V/D. GU: No Urinary s/sx Musculoskeletal: No joint pain Neuro: No headache, no dizziness, no motor weakness.  Skin: No rash Endocrine:  No polydipsia. No polyuria.  Psych: Denies SI/HI  No problems updated.  ALLERGIES: Allergies  Allergen Reactions  . Penicillins Hives and Swelling    Has patient had a PCN reaction causing immediate rash, facial/tongue/throat swelling, SOB or lightheadedness with hypotension: YES Has patient had a PCN reaction causing severe rash involving mucus membranes or skin necrosis: No Has patient had a PCN reaction that required hospitalization: Yes Has patient had a PCN reaction occurring within the last 10 years: No If all of the above answers are "NO", then may proceed with Cephalosporin use.    PAST MEDICAL HISTORY: Past Medical History:  Diagnosis Date  . Hyperlipidemia 2011  . Hypertension 2010  . Lymphedema    "qone on my mom's side have had problems w/lymphedema; mine started in 2005"    [place]: Prior to Admission medications   Medication Sig Start Date End Date Taking? Authorizing Provider  amLODipine (NORVASC) 10 MG tablet Take 1 tablet by mouth daily to control blood pressure 03/05/19  Yes Steele Ledonne M, PA-C  hydrochlorothiazide (HYDRODIURIL) 25 MG tablet Take 1 tablet (25 mg total) by mouth daily.  As needed for lymphedema/swelling 03/05/19  Yes Argentina Donovan, PA-C  ibuprofen (ADVIL,MOTRIN) 200 MG tablet Take 600 mg every 6 (six) hours as needed by mouth.   Yes [provider]  metoprolol succinate (TOPROL-XL) 50 MG 24 hr tablet Take 1 tablet (50 mg total) by mouth daily. Take with or immediately following a meal. 03/05/19  Yes Elaisha Zahniser M, PA-C  simvastatin (ZOCOR) 20 MG tablet TAKE 1 TABLET BY MOUTH ONCE DAILY after the evening meal to lower cholesterol 03/05/19  Yes Crispin Vogel, Dionne Bucy, PA-C     Objective:  EXAM:   Vitals:   03/05/19 0839  BP: (!) 156/92  Pulse: 98  Temp: 98.7 F (37.1 C)  TempSrc: Oral  SpO2: 96%  Weight: 241 lb (109.3 kg)  Height: 5\' 7"  (1.702 m)    General appearance : A&OX3. NAD. Non-toxic-appearing HEENT: Atraumatic and Normocephalic.  PERRLA. EOM intact.   Chest/Lungs:  Breathing-non-labored, Good air entry bilaterally, breath sounds normal without rales, rhonchi, or wheezing  CVS: S1 S2 regular, no murmurs, gallops, rubs  Neurology:  CN II-XII grossly intact, Non focal.   Psych:  TP linear. J/I WNL. Normal speech. Appropriate eye contact and affect.  Skin:  No Rash  Data Review Lab Results  Component Value Date   HGBA1C 5.0 09/28/2016   HGBA1C 5.2 09/10/2015     Assessment & Plan   1. Essential hypertension Not controlled but Out of meds.  Check BP OOO and if not at  goal after resuming meds, make appt in 3 weeks - Comprehensive metabolic panel - CBC with Differential - amLODipine (NORVASC) 10 MG tablet; Take 1 tablet by mouth daily to control blood pressure  Dispense: 90 tablet; Refill: 1 - hydrochlorothiazide (HYDRODIURIL) 25 MG tablet; Take 1 tablet (25 mg total) by mouth daily. As needed for lymphedema/swelling  Dispense: 90 tablet; Refill: 1 - metoprolol succinate (TOPROL-XL) 50 MG 24 hr tablet; Take 1 tablet (50 mg total) by mouth daily. Take with or immediately following a meal.  Dispense: 90 tablet; Refill: 1  2.  Moderate mixed hyperlipidemia not requiring statin therapy Healthy diet discussed - Lipid panel - CBC with Differential - simvastatin (ZOCOR) 20 MG tablet; TAKE 1 TABLET BY MOUTH ONCE DAILY after the evening meal to lower cholesterol  Dispense: 90 tablet; Refill: 1  3. Screening for prostate cancer - PSA  4. Screening for diabetes mellitus I have had a lengthy discussion and provided education about insulin resistance and the intake of too much sugar/refined carbohydrates.  I have advised the patient to work at a goal of eliminating sugary drinks, candy, desserts, sweets, refined sugars, processed foods, and white carbohydrates.  The patient expresses understanding.  - Hemoglobin A1c  5. Lymphedema of left leg - hydrochlorothiazide (HYDRODIURIL) 25 MG tablet; Take 1 tablet (25 mg total) by mouth daily. As needed for lymphedema/swelling  Dispense: 90 tablet; Refill: 1  6. Dyslipidemia with high LDL and low HDL - hydrochlorothiazide (HYDRODIURIL) 25 MG tablet; Take 1 tablet (25 mg total) by mouth daily. As needed for lymphedema/swelling  Dispense: 90 tablet; Refill: 1 - simvastatin (ZOCOR) 20 MG tablet; TAKE 1 TABLET BY MOUTH ONCE DAILY after the evening meal to lower cholesterol  Dispense: 90 tablet; Refill: 1   Patient have been counseled extensively about nutrition and exercise  Return in about 6 months (around 09/02/2019) for with PCP.  The patient was given clear instructions to go to ER or return to medical center if symptoms don't improve, worsen or new problems develop. The patient verbalized understanding. The patient was told to call to get lab results if they haven't heard anything in the next week.     Georgian Co, PA-C [facility] and [facility] [address], [place] [phone]   03/05/2019, 8:55 AM

## 2019-03-05 NOTE — Patient Instructions (Signed)

## 2019-03-06 LAB — CBC WITH DIFFERENTIAL/PLATELET
Basophils Absolute: 0.1 10*3/uL (ref 0.0–0.2)
Basos: 1 %
EOS (ABSOLUTE): 0.4 10*3/uL (ref 0.0–0.4)
Eos: 7 %
Hematocrit: 42.3 % (ref 37.5–51.0)
Hemoglobin: 15 g/dL (ref 13.0–17.7)
Immature Grans (Abs): 0 10*3/uL (ref 0.0–0.1)
Immature Granulocytes: 0 %
Lymphocytes Absolute: 2.8 10*3/uL (ref 0.7–3.1)
Lymphs: 51 %
MCH: 32.1 pg (ref 26.6–33.0)
MCHC: 35.5 g/dL (ref 31.5–35.7)
MCV: 90 fL (ref 79–97)
Monocytes Absolute: 0.5 10*3/uL (ref 0.1–0.9)
Monocytes: 9 %
Neutrophils Absolute: 1.8 10*3/uL (ref 1.4–7.0)
Neutrophils: 32 %
Platelets: 208 10*3/uL (ref 150–450)
RBC: 4.68 x10E6/uL (ref 4.14–5.80)
RDW: 12.7 % (ref 11.6–15.4)
WBC: 5.5 10*3/uL (ref 3.4–10.8)

## 2019-03-06 LAB — COMPREHENSIVE METABOLIC PANEL
ALT: 27 IU/L (ref 0–44)
AST: 25 IU/L (ref 0–40)
Albumin/Globulin Ratio: 1.4 (ref 1.2–2.2)
Albumin: 4.5 g/dL (ref 3.8–4.9)
Alkaline Phosphatase: 77 IU/L (ref 39–117)
BUN/Creatinine Ratio: 7 — ABNORMAL LOW (ref 9–20)
BUN: 8 mg/dL (ref 6–24)
Bilirubin Total: 0.6 mg/dL (ref 0.0–1.2)
CO2: 22 mmol/L (ref 20–29)
Calcium: 9.2 mg/dL (ref 8.7–10.2)
Chloride: 100 mmol/L (ref 96–106)
Creatinine, Ser: 1.23 mg/dL (ref 0.76–1.27)
GFR calc Af Amer: 76 mL/min/{1.73_m2} (ref >59)
GFR calc non Af Amer: 66 mL/min/{1.73_m2} (ref >59)
Globulin, Total: 3.2 g/dL (ref 1.5–4.5)
Glucose: 110 mg/dL — ABNORMAL HIGH (ref 65–99)
Potassium: 3.8 mmol/L (ref 3.5–5.2)
Sodium: 137 mmol/L (ref 134–144)
Total Protein: 7.7 g/dL (ref 6.0–8.5)

## 2019-03-06 LAB — LIPID PANEL
Chol/HDL Ratio: 4.5 ratio (ref 0.0–5.0)
Cholesterol, Total: 158 mg/dL (ref 100–199)
HDL: 35 mg/dL — ABNORMAL LOW (ref >39)
LDL Chol Calc (NIH): 100 mg/dL — ABNORMAL HIGH (ref 0–99)
Triglycerides: 125 mg/dL (ref 0–149)
VLDL Cholesterol Cal: 23 mg/dL (ref 5–40)

## 2019-03-06 LAB — HEMOGLOBIN A1C
Est. average glucose Bld gHb Est-mCnc: 111 mg/dL
Hgb A1c MFr Bld: 5.5 % (ref 4.8–5.6)

## 2019-03-06 LAB — PSA: Prostate Specific Ag, Serum: 1.5 ng/mL (ref 0.0–4.0)

## 2019-03-28 MED FILL — AMLODIPINE BESYLATE 10 MG T: 10 | 30 days supply | Qty: 30 | Fill #1

## 2019-03-28 MED FILL — ?SIMVASTATIN 20 MG TABLETS: 20 | 30 days supply | Qty: 30 | Fill #1

## 2019-03-28 MED FILL — ?METOPROLOL SUCC ER 50MG TA: 50 | 30 days supply | Qty: 30 | Fill #1

## 2019-04-29 MED FILL — ?SIMVASTATIN 20 MG TABLETS: 20 | 30 days supply | Qty: 30 | Fill #2

## 2019-04-29 MED FILL — METOPROLOL SUCCINATE ER 50: 50 | 30 days supply | Qty: 30 | Fill #2

## 2019-04-29 MED FILL — AMLODIPINE BESYLATE 10 MG T: 10 | 30 days supply | Qty: 30 | Fill #2

## 2019-05-27 MED FILL — METOPROLOL SUCCINATE ER 50: 50 | 30 days supply | Qty: 30 | Fill #3

## 2019-05-27 MED FILL — ?SIMVASTATIN 20 MG TABLETS: 20 | 30 days supply | Qty: 30 | Fill #3

## 2019-05-27 MED FILL — AMLODIPINE BESYLATE 10 MG T: 10 | 30 days supply | Qty: 30 | Fill #3

## 2019-06-24 MED FILL — ?SIMVASTATIN 20MG TABLE: 20 | 30 days supply | Qty: 30 | Fill #4

## 2019-06-24 MED FILL — AMLODIPINE BESYLATE 10 MG T: 10 | 30 days supply | Qty: 30 | Fill #4

## 2019-06-24 MED FILL — METOPROLOL SUCCINATE ER 50: 50 | 30 days supply | Qty: 30 | Fill #4

## 2019-07-29 MED FILL — METOPROLOL SUCCINATE ER 50: 50 | 30 days supply | Qty: 30 | Fill #5

## 2019-07-29 MED FILL — ?SIMVASTATIN 20MG TABLE: 20 | 30 days supply | Qty: 30 | Fill #5

## 2019-07-29 MED FILL — AMLODIPINE BESYLATE 10 MG T: 10 | 30 days supply | Qty: 30 | Fill #5

## 2019-09-08 ENCOUNTER — Other Ambulatory Visit: Payer: Self-pay

## 2019-09-08 ENCOUNTER — Other Ambulatory Visit: Payer: Self-pay | Admitting: Family

## 2019-09-08 ENCOUNTER — Other Ambulatory Visit: Payer: Self-pay | Admitting: Physician Assistant

## 2019-09-08 ENCOUNTER — Ambulatory Visit: Payer: Self-pay | Attending: Family | Admitting: Family

## 2019-09-08 DIAGNOSIS — I10 Essential (primary) hypertension: Secondary | ICD-10-CM

## 2019-09-08 DIAGNOSIS — E785 Hyperlipidemia, unspecified: Secondary | ICD-10-CM

## 2019-09-08 DIAGNOSIS — I890 Lymphedema, not elsewhere classified: Secondary | ICD-10-CM

## 2019-09-08 MED ORDER — HYDROCHLOROTHIAZIDE 25 MG PO TABS: 25.0000 mg | ORAL_TABLET | Freq: Every day | ORAL | 0 refills | Status: DC

## 2019-09-08 MED ORDER — METOPROLOL SUCCINATE ER 50 MG PO TB24: 50.0000 mg | ORAL_TABLET | Freq: Every day | ORAL | 0 refills | Status: DC

## 2019-09-08 MED ORDER — AMLODIPINE BESYLATE 10 MG PO TABS: ORAL_TABLET | ORAL | 0 refills | Status: DC

## 2019-09-08 MED ORDER — SIMVASTATIN 20 MG PO TABS: ORAL_TABLET | ORAL | 0 refills | Status: DC

## 2019-09-08 MED FILL — HYDROCHLOROTHIAZIDE 25 MG T: 25 | 30 days supply | Qty: 30 | Fill #0

## 2019-09-08 MED FILL — METOPROLOL SUCCINATE ER 50: 50 | 30 days supply | Qty: 30 | Fill #0

## 2019-09-08 MED FILL — SIMVASTATIN 20 MG TABLET: 20 | 30 days supply | Qty: 30 | Fill #0

## 2019-09-08 MED FILL — AMLODIPINE BESYLATE 10 MG T: 10 | 30 days supply | Qty: 30 | Fill #0

## 2019-09-08 NOTE — Patient Instructions (Signed)
Continue Amlodipine, Hydrochlorothiazide, and Metoprolol for blood pressure. Continue Simvastatin for cholesterol. Continue Hydrochlorothiazide for lymphedema. Lab this week. Follow-up with primary physician in 4 months or sooner if needed. Hypertension, Adult Hypertension is another name for high blood pressure. High blood pressure forces your heart to work harder to pump blood. This can cause problems over time. There are two numbers in a blood pressure reading. There is a top number (systolic) over a bottom number (diastolic). It is best to have a blood pressure that is below 120/80. Healthy choices can help lower your blood pressure, or you may need medicine to help lower it. What are the causes? The cause of this condition is not known. Some conditions may be related to high blood pressure. What increases the risk?  Smoking.  Having type 2 diabetes mellitus, high cholesterol, or both.  Not getting enough exercise or physical activity.  Being overweight.  Having too much fat, sugar, calories, or salt (sodium) in your diet.  Drinking too much alcohol.  Having long-term (chronic) kidney disease.  Having a family history of high blood pressure.  Age. Risk increases with age.  Race. You may be at higher risk if you are African American.  Gender. Men are at higher risk than women before age 5. After age 15, women are at higher risk than men.  Having obstructive sleep apnea.  Stress. What are the signs or symptoms?  High blood pressure may not cause symptoms. Very high blood pressure (hypertensive crisis) may cause: ? Headache. ? Feelings of worry or nervousness (anxiety). ? Shortness of breath. ? Nosebleed. ? A feeling of being sick to your stomach (nausea). ? Throwing up (vomiting). ? Changes in how you see. ? Very bad chest pain. ? Seizures. How is this treated?  This condition is treated by making healthy lifestyle changes, such as: ? Eating healthy  foods. ? Exercising more. ? Drinking less alcohol.  Your health care provider may prescribe medicine if lifestyle changes are not enough to get your blood pressure under control, and if: ? Your top number is above 130. ? Your bottom number is above 80.  Your personal target blood pressure may vary. Follow these instructions at home: Eating and drinking   If told, follow the DASH eating plan. To follow this plan: ? Fill one half of your plate at each meal with fruits and vegetables. ? Fill one fourth of your plate at each meal with whole grains. Whole grains include whole-wheat pasta, brown rice, and whole-grain bread. ? Eat or drink low-fat dairy products, such as skim milk or low-fat yogurt. ? Fill one fourth of your plate at each meal with low-fat (lean) proteins. Low-fat proteins include fish, chicken without skin, eggs, beans, and tofu. ? Avoid fatty meat, cured and processed meat, or chicken with skin. ? Avoid pre-made or processed food.  Eat less than 1,500 mg of salt each day.  Do not drink alcohol if: ? Your doctor tells you not to drink. ? You are pregnant, may be pregnant, or are planning to become pregnant.  If you drink alcohol: ? Limit how much you use to:  0-1 drink a day for women.  0-2 drinks a day for men. ? Be aware of how much alcohol is in your drink. In the [place] one drink equals one 12 oz bottle of beer (355 mL), one 5 oz glass of wine (148 mL), or one 1 oz glass of hard liquor (44 mL). Lifestyle   Work with your  doctor to stay at a healthy weight or to lose weight. Ask your doctor what the best weight is for you.  Get at least 30 minutes of exercise most days of the week. This may include walking, swimming, or biking.  Get at least 30 minutes of exercise that strengthens your muscles (resistance exercise) at least 3 days a week. This may include lifting weights or doing Pilates.  Do not use any products that contain nicotine or tobacco, such as  cigarettes, e-cigarettes, and chewing tobacco. If you need help quitting, ask your doctor.  Check your blood pressure at home as told by your doctor.  Keep all follow-up visits as told by your doctor. This is important. Medicines  Take over-the-counter and prescription medicines only as told by your doctor. Follow directions carefully.  Do not skip doses of blood pressure medicine. The medicine does not work as well if you skip doses. Skipping doses also puts you at risk for problems.  Ask your doctor about side effects or reactions to medicines that you should watch for. Contact a doctor if you:  Think you are having a reaction to the medicine you are taking.  Have headaches that keep coming back (recurring).  Feel dizzy.  Have swelling in your ankles.  Have trouble with your vision. Get help right away if you:  Get a very bad headache.  Start to feel mixed up (confused).  Feel weak or numb.  Feel faint.  Have very bad pain in your: ? Chest. ? Belly (abdomen).  Throw up more than once.  Have trouble breathing. Summary  Hypertension is another name for high blood pressure.  High blood pressure forces your heart to work harder to pump blood.  For most people, a normal blood pressure is less than 120/80.  Making healthy choices can help lower blood pressure. If your blood pressure does not get lower with healthy choices, you may need to take medicine. This information is not intended to replace advice given to you by your health care provider. Make sure you discuss any questions you have with your health care provider. Document Revised: 10/24/2017 Document Reviewed: 10/24/2017 Elsevier Patient Education  2020 [employer].

## 2019-09-08 NOTE — Progress Notes (Signed)
Virtual Visit via Telephone Note  I connected with Brian Wade, on 09/08/2019 at 8:50 AM by telephone due to the COVID-19 pandemic and verified that I am speaking with the correct person using two identifiers.  Due to current restrictions/limitations of in-office visits due to the COVID-19 pandemic, this scheduled clinical appointment was converted to a telehealth visit.   Consent: I discussed the limitations, risks, security and privacy concerns of performing an evaluation and management service by telephone and the availability of in person appointments. I also discussed with the patient that there may be a patient responsible charge related to this service. The patient expressed understanding and agreed to proceed.  Location of Patient: Home  Location of Provider: [facility]  Persons participating in Telemedicine visit: Bentzion Dauria, NP Laruth Bouchard, CMA  History of Present Illness: Brian Wade is a 54 year old male with history of hypertension, hyperlipidemia, lymphedema of left leg, sepsis, and cellulitis of left lower extremity who presents today for chronic conditions follow-up.   1. HYPERTENSION FOLLOW-UP: Med Adherence: [x]  Yes, reports taking Hydrochlorothiazide only as needed. Reports the last time he took this medication was 2 days ago. Reports he takes this medication to help with lymphedema and it helps. Medication side effects: []  Yes    [x]  No Adherence with salt restriction: [x]  Yes, trying  Exercise: Yes [x]  walk 25 to 30 minutes every other day Home Monitoring?: []  Yes    [x]  No Monitoring Frequency: []  Yes    [x]  No Home BP results range: [x]  Yes, reports checks occasionally while in a store. Reports last blood pressure he can remember was 119/81 Smoking []  Yes [x]  No SOB? []  Yes    [x]  No Chest Pain?: []  Yes    [x]  No Leg swelling?: [x]  Yes, related to lymphedema Headaches?: []  Yes    [x]  No Dizziness? []  Yes    [x]   No Comments: Last visit 03/05/2019 with physician assistant . During that encounter blood pressure uncontrolled. Continued on Amlodipine 10 mg daily, Hydrochlorothiazide 25 mg daily, and Metoprolol 50 mg daily. CMP and CBC obtained.   2. HYPERLIPIDEMIA FOLLOW-UP: Last Lipid Panel results:  HDL  Date Value Ref Range Status  03/05/2019 35 (L) >39 mg/dL Final   Triglycerides  Date Value Ref Range Status  03/05/2019 125 0 - 149 mg/dL Final    Med Adherence: [x]  Yes    []  No Medication side effects: [x]  No, reports he does hear others talk about the side effects of Statins and looks up information himself from time to time Muscle aches:  []  Yes    [x]  No Diet Adherence: [x]  Yes    []  No Comments:  Last visit 03/05/2019 with physician assistant . During that encounter Simvastatin continued. Lipid panel and CBC obtained.   3. LYMPHEDEMA FOLLOW-UP: Last visit 03/05/2019 with physician assistant . During that encounter Hydrochlorothiazide prescribed for lymphedema swelling.  Today reports lymphedema is stable and taking Hydrochlorothiazide is helping with swelling.  Past Medical History:  Diagnosis Date  . Hyperlipidemia 2011  . Hypertension 2010  . Lymphedema    "qone on my mom's side have had problems w/lymphedema; mine started in 2005"   Allergies  Allergen Reactions  . Penicillins Hives and Swelling    Has patient had a PCN reaction causing immediate rash, facial/tongue/throat swelling, SOB or lightheadedness with hypotension: YES Has patient had a PCN reaction causing severe rash involving mucus membranes or skin necrosis: No Has patient had a PCN reaction  that required hospitalization: Yes Has patient had a PCN reaction occurring within the last 10 years: No If all of the above answers are "NO", then may proceed with Cephalosporin use.    Current Outpatient Medications on File Prior to Visit  Medication Sig Dispense Refill  . amLODipine (NORVASC) 10 MG tablet  Take 1 tablet by mouth daily to control blood pressure 90 tablet 1  . hydrochlorothiazide (HYDRODIURIL) 25 MG tablet Take 1 tablet (25 mg total) by mouth daily. As needed for lymphedema/swelling 90 tablet 1  . ibuprofen (ADVIL,MOTRIN) 200 MG tablet Take 600 mg every 6 (six) hours as needed by mouth.    . metoprolol succinate (TOPROL-XL) 50 MG 24 hr tablet Take 1 tablet (50 mg total) by mouth daily. Take with or immediately following a meal. 90 tablet 1  . simvastatin (ZOCOR) 20 MG tablet TAKE 1 TABLET BY MOUTH ONCE DAILY after the evening meal to lower cholesterol 90 tablet 1   No current facility-administered medications on file prior to visit.    Observations/Objective: Alert and oriented x 3. Not in acute distress. Physical examination not completed as this is a telemedicine visit.  Assessment and Plan: 1. Essential hypertension: -Level of blood pressure control unknown as patient does not monitor blood pressure at home.  -Continue Amlodipine, Metoprolol, and Hydrochlorothiazide as prescribed.  -BMP to evaluate kidney function and electrolyte balance. Reports he will come in today to have this done.  -Last CMP 03/05/2019. -Counseled on blood pressure goal of less than 130/80, low-sodium, DASH diet, medication compliance, 150 minutes of moderate intensity exercise per week as tolerated. Discussed medication compliance, adverse effects. -Follow-up with primary physician in 4 months or sooner if needed. - Basic Metabolic Panel - amLODipine (NORVASC) 10 MG tablet; Take 1 tablet by mouth daily to control blood pressure  Dispense: 120 tablet; Refill: 0 - hydrochlorothiazide (HYDRODIURIL) 25 MG tablet; Take 1 tablet (25 mg total) by mouth daily. As needed for lymphedema/swelling  Dispense: 120 tablet; Refill: 0 - metoprolol succinate (TOPROL-XL) 50 MG 24 hr tablet; Take 1 tablet (50 mg total) by mouth daily. Take with or immediately following a meal.  Dispense: 120 tablet; Refill: 0  2.  Dyslipidemia with high LDL and low HDL: -Continue Simvastatin as prescribed.  -Practice low-fat heart healthy diet and at least 150 minutes of moderate intensity exercise weekly as tolerated.  -Last lipid panel 03/05/2019. -Follow-up with primary physician in 4 months or sooner if needed. - simvastatin (ZOCOR) 20 MG tablet; TAKE 1 TABLET BY MOUTH ONCE DAILY after the evening meal to lower cholesterol  Dispense: 120 tablet; Refill: 0  3. Lymphedema of left leg: -Continue Hydrochlorothiazide as prescribed for lymphedema of left leg.  -Follow-up with primary physician as needed. - hydrochlorothiazide (HYDRODIURIL) 25 MG tablet; Take 1 tablet (25 mg total) by mouth daily. As needed for lymphedema/swelling  Dispense: 120 tablet; Refill: 0   Follow Up Instructions: Follow-up with primary physician in 4 months or sooner if needed.    Patient was given clear instructions to go to Emergency Department or return to medical center if symptoms don't improve, worsen, or new problems develop.The patient verbalized understanding.  I discussed the assessment and treatment plan with the patient. The patient was provided an opportunity to ask questions and all were answered. The patient agreed with the plan and demonstrated an understanding of the instructions.   The patient was advised to call back or seek an in-person evaluation if the symptoms worsen or if the condition fails  to improve as anticipated.  I provided 6 minutes total of non-face-to-face time during this encounter including median intraservice time, reviewing previous notes, labs, imaging, medications, management and patient verbalized understanding.    Rema Fendt, NP  [facility] [place], [place] [phone]   09/08/2019, 7:32 AM

## 2019-10-08 MED FILL — SIMVASTATIN 20 MG TABLET: 20 | 30 days supply | Qty: 30 | Fill #1

## 2019-10-08 MED FILL — AMLODIPINE BESYLATE 10 MG T: 10 | 30 days supply | Qty: 30 | Fill #1

## 2019-10-08 MED FILL — METOPROLOL SUCCINATE ER 50: 50 | 30 days supply | Qty: 30 | Fill #1

## 2019-11-10 MED FILL — METOPROLOL SUCCINATE ER 50: 50 | 30 days supply | Qty: 30 | Fill #2

## 2019-11-10 MED FILL — SIMVASTATIN 20 MG TABLET: 20 | 30 days supply | Qty: 30 | Fill #2

## 2019-11-10 MED FILL — AMLODIPINE BESYLATE 10 MG T: 10 | 30 days supply | Qty: 30 | Fill #2

## 2019-12-09 MED FILL — SIMVASTATIN 20 MG TABLET: 20 | 30 days supply | Qty: 30 | Fill #3

## 2019-12-09 MED FILL — METOPROLOL SUCCINATE ER 50: 50 | 30 days supply | Qty: 30 | Fill #3

## 2019-12-09 MED FILL — AMLODIPINE BESYLATE 10 MG T: 10 | 30 days supply | Qty: 30 | Fill #3

## 2019-12-24 ENCOUNTER — Other Ambulatory Visit: Payer: Self-pay | Admitting: Family Medicine

## 2019-12-24 ENCOUNTER — Other Ambulatory Visit: Payer: Self-pay

## 2019-12-24 ENCOUNTER — Encounter: Payer: Self-pay | Admitting: Family Medicine

## 2019-12-24 ENCOUNTER — Ambulatory Visit: Payer: Self-pay | Attending: Family Medicine | Admitting: Family Medicine

## 2019-12-24 DIAGNOSIS — E785 Hyperlipidemia, unspecified: Secondary | ICD-10-CM

## 2019-12-24 DIAGNOSIS — I890 Lymphedema, not elsewhere classified: Secondary | ICD-10-CM

## 2019-12-24 DIAGNOSIS — I10 Essential (primary) hypertension: Secondary | ICD-10-CM

## 2019-12-24 MED ORDER — AMLODIPINE BESYLATE 10 MG PO TABS: ORAL_TABLET | ORAL | 0 refills | Status: DC

## 2019-12-24 MED ORDER — METOPROLOL SUCCINATE ER 50 MG PO TB24: 50.0000 mg | ORAL_TABLET | Freq: Every day | ORAL | 0 refills | Status: DC

## 2019-12-24 MED ORDER — SIMVASTATIN 20 MG PO TABS: ORAL_TABLET | ORAL | 0 refills | Status: DC

## 2019-12-24 MED ORDER — HYDROCHLOROTHIAZIDE 25 MG PO TABS: 25.0000 mg | ORAL_TABLET | Freq: Every day | ORAL | 0 refills | Status: DC

## 2019-12-24 NOTE — Progress Notes (Signed)
ALL MEDS NEED REFILL

## 2019-12-24 NOTE — Patient Instructions (Signed)

## 2019-12-24 NOTE — Progress Notes (Signed)
Established Patient Office Visit  Subjective:  Patient ID: Brian Wade, male    DOB: March 21, 1965  Age: 54 y.o. MRN: [id number]  CC:  Chief Complaint  Patient presents with  . Medication Refill    HPI Brian Wade, very pleasant 54 year old male, who presents for follow-up of chronic medical issues including hypertension, dyslipidemia and patient with chronic lymphedema of the left lower extremity.  He reports that since his last visit he has lost about 5 pounds.  He unfortunately has had recent issues obtaining healthy food.  He has been out of his medications and he feels that this is why his blood pressure is elevated today's visit.  Hydrochlorothiazide helps a little with his chronic left lower leg swelling.  He sometimes has leg discomfort due to the swelling.  He also needs a refill of his cholesterol medication.  He denies increased muscle or joint aches while taking the cholesterol medicine.  Past Medical History:  Diagnosis Date  . Hyperlipidemia 2011  . Hypertension 2010  . Lymphedema    "qone on my mom's side have had problems w/lymphedema; mine started in 2005"    Past Surgical History:  Procedure Laterality Date  . NO PAST SURGERIES      Family History  Problem Relation Age of Onset  . Heart disease Mother   . Hypertension Mother   . Alzheimer's disease Mother   . Colon polyps Mother   . Asthma Sister   . Diabetes Maternal Aunt   . Cancer Maternal Grandmother   . Colon cancer Brother     Social History   Socioeconomic History  . Marital status: Single    Spouse name: Not on file  . Number of children: Not on file  . Years of education: Not on file  . Highest education level: Not on file  Occupational History  . Not on file  Tobacco Use  . Smoking status: Never Smoker  . Smokeless tobacco: Never Used  Vaping Use  . Vaping Use: Never used  Substance and Sexual Activity  . Alcohol use: No  . Drug use: No  . Sexual activity: Not Currently  Other  Topics Concern  . Not on file  Social History Narrative  . Not on file   Social Determinants of Health   Financial Resource Strain:   . Difficulty of Paying Living Expenses: Not on file  Food Insecurity:   . Worried About Programme researcher, broadcasting/film/video in the Last Year: Not on file  . Ran Out of Food in the Last Year: Not on file  Transportation Needs:   . Lack of Transportation (Medical): Not on file  . Lack of Transportation (Non-Medical): Not on file  Physical Activity:   . Days of Exercise per Week: Not on file  . Minutes of Exercise per Session: Not on file  Stress:   . Feeling of Stress : Not on file  Social Connections:   . Frequency of Communication with Friends and Family: Not on file  . Frequency of Social Gatherings with Friends and Family: Not on file  . Attends Religious Services: Not on file  . Active Member of Clubs or Organizations: Not on file  . Attends Banker Meetings: Not on file  . Marital Status: Not on file  Intimate Partner Violence:   . Fear of Current or Ex-Partner: Not on file  . Emotionally Abused: Not on file  . Physically Abused: Not on file  . Sexually Abused: Not on file  Outpatient Medications Prior to Visit  Medication Sig Dispense Refill  . amLODipine (NORVASC) 10 MG tablet Take 1 tablet by mouth daily to control blood pressure 120 tablet 0  . hydrochlorothiazide (HYDRODIURIL) 25 MG tablet Take 1 tablet (25 mg total) by mouth daily. As needed for lymphedema/swelling 120 tablet 0  . metoprolol succinate (TOPROL-XL) 50 MG 24 hr tablet Take 1 tablet (50 mg total) by mouth daily. Take with or immediately following a meal. 120 tablet 0  . simvastatin (ZOCOR) 20 MG tablet TAKE 1 TABLET BY MOUTH ONCE DAILY after the evening meal to lower cholesterol 120 tablet 0  . ibuprofen (ADVIL,MOTRIN) 200 MG tablet Take 600 mg every 6 (six) hours as needed by mouth. (Patient not taking: Reported on 12/24/2019)     No facility-administered medications  prior to visit.    Allergies  Allergen Reactions  . Penicillins Hives and Swelling    Has patient had a PCN reaction causing immediate rash, facial/tongue/throat swelling, SOB or lightheadedness with hypotension: YES Has patient had a PCN reaction causing severe rash involving mucus membranes or skin necrosis: No Has patient had a PCN reaction that required hospitalization: Yes Has patient had a PCN reaction occurring within the last 10 years: No If all of the above answers are "NO", then may proceed with Cephalosporin use.    ROS Review of Systems  Constitutional: Negative for chills and fever.  HENT: Negative for sore throat and trouble swallowing.   Respiratory: Negative for cough and shortness of breath.   Cardiovascular: Positive for leg swelling (chronic lymphedema). Negative for chest pain and palpitations.  Gastrointestinal: Negative for abdominal pain, constipation, diarrhea and nausea.  Endocrine: Negative for polydipsia, polyphagia and polyuria.  Genitourinary: Negative for dysuria and frequency.  Musculoskeletal: Positive for arthralgias.  Neurological: Negative for dizziness and headaches.  Hematological: Negative for adenopathy. Does not bruise/bleed easily.  Psychiatric/Behavioral: Negative for suicidal ideas. The patient is not nervous/anxious.       Objective:    Physical Exam Vitals and nursing note reviewed.  Constitutional:      General: He is not in acute distress.    Appearance: Normal appearance. He is obese.  Neck:     Vascular: No carotid bruit.  Cardiovascular:     Rate and Rhythm: Normal rate and regular rhythm.  Pulmonary:     Effort: Pulmonary effort is normal.     Breath sounds: Normal breath sounds.  Abdominal:     Palpations: Abdomen is soft.     Tenderness: There is no abdominal tenderness. There is no guarding or rebound.  Musculoskeletal:     Cervical back: Neck supple. No tenderness.     Right lower leg: Edema present.     Left  lower leg: Edema (chronic LLE edema-nonpitting) present.  Lymphadenopathy:     Cervical: No cervical adenopathy.  Skin:    General: Skin is warm and dry.  Neurological:     General: No focal deficit present.     Mental Status: He is alert and oriented to person, place, and time.  Psychiatric:        Mood and Affect: Mood normal.        Behavior: Behavior normal.     BP (!) 157/98 (BP Location: Left Arm, Patient Position: Sitting)   Pulse 78   Temp 98.4 F (36.9 C)   Wt 235 lb 9.6 oz (106.9 kg)   SpO2 97%   BMI 36.90 kg/m  Repeat BP of 146/90 Wt Readings from Last  3 Encounters:  12/24/19 235 lb 9.6 oz (106.9 kg)  03/05/19 241 lb (109.3 kg)  11/07/17 225 lb 6.4 oz (102.2 kg)      Lab Results  Component Value Date   TSH 3.620 05/20/2013   Lab Results  Component Value Date   WBC 5.5 03/05/2019   HGB 15.0 03/05/2019   HCT 42.3 03/05/2019   MCV 90 03/05/2019   PLT 208 03/05/2019   Lab Results  Component Value Date   NA 137 03/05/2019   K 3.8 03/05/2019   CO2 22 03/05/2019   GLUCOSE 110 (H) 03/05/2019   BUN 8 03/05/2019   CREATININE 1.23 03/05/2019   BILITOT 0.6 03/05/2019   ALKPHOS 77 03/05/2019   AST 25 03/05/2019   ALT 27 03/05/2019   PROT 7.7 03/05/2019   ALBUMIN 4.5 03/05/2019   CALCIUM 9.2 03/05/2019   ANIONGAP 6 01/18/2017   Lab Results  Component Value Date   CHOL 158 03/05/2019   Lab Results  Component Value Date   HDL 35 (L) 03/05/2019   Lab Results  Component Value Date   LDLCALC 100 (H) 03/05/2019   Lab Results  Component Value Date   TRIG 125 03/05/2019   Lab Results  Component Value Date   CHOLHDL 4.5 03/05/2019   Lab Results  Component Value Date   HGBA1C 5.5 03/05/2019      Assessment & Plan:  1. Dyslipidemia with high LDL and low HDL Prior labs reviewed and refill provided for simvastatin. Encouraged patient to look for food distributions of fresh produce.  - simvastatin (ZOCOR) 20 MG tablet; TAKE 1 TABLET BY MOUTH ONCE  DAILY after the evening meal to lower cholesterol  Dispense: 120 tablet; Refill: 0  2. Essential hypertension Blood pressure is slightly elevated today but patient reports recent increased salt intake and poor food choices. Medications refilled and info on hypertension provided as part of after visit summary.  - amLODipine (NORVASC) 10 MG tablet; Take 1 tablet by mouth daily to control blood pressure  Dispense: 120 tablet; Refill: 0 - hydrochlorothiazide (HYDRODIURIL) 25 MG tablet; Take 1 tablet (25 mg total) by mouth daily. As needed for lymphedema/swelling  Dispense: 120 tablet; Refill: 0 - metoprolol succinate (TOPROL-XL) 50 MG 24 hr tablet; Take 1 tablet (50 mg total) by mouth daily. Take with or immediately following a meal.  Dispense: 120 tablet; Refill: 0  3. Lymphedema of left leg Chronic lymphedema which is improved with the use of HCTZ which was refilled. Patient encouraged to limit salt intake. - hydrochlorothiazide (HYDRODIURIL) 25 MG tablet; Take 1 tablet (25 mg total) by mouth daily. As needed for lymphedema/swelling  Dispense: 120 tablet; Refill: 0   Meds ordered this encounter  Medications  . amLODipine (NORVASC) 10 MG tablet    Sig: Take 1 tablet by mouth daily to control blood pressure    Dispense:  120 tablet    Refill:  0  . hydrochlorothiazide (HYDRODIURIL) 25 MG tablet    Sig: Take 1 tablet (25 mg total) by mouth daily. As needed for lymphedema/swelling    Dispense:  120 tablet    Refill:  0  . metoprolol succinate (TOPROL-XL) 50 MG 24 hr tablet    Sig: Take 1 tablet (50 mg total) by mouth daily. Take with or immediately following a meal.    Dispense:  120 tablet    Refill:  0  . simvastatin (ZOCOR) 20 MG tablet    Sig: TAKE 1 TABLET BY MOUTH ONCE DAILY after the  evening meal to lower cholesterol    Dispense:  120 tablet    Refill:  0    Follow-up: Return in about 5 months (around 05/23/2020) for HTN/lipids and fasting labs.   Cain Saupe, MD

## 2019-12-25 ENCOUNTER — Other Ambulatory Visit: Payer: Self-pay | Admitting: Family Medicine

## 2019-12-25 DIAGNOSIS — E785 Hyperlipidemia, unspecified: Secondary | ICD-10-CM

## 2019-12-25 DIAGNOSIS — I10 Essential (primary) hypertension: Secondary | ICD-10-CM

## 2019-12-25 MED FILL — HYDROCHLOROTHIAZIDE 25 MG T: 25 | 30 days supply | Qty: 30 | Fill #0

## 2019-12-28 ENCOUNTER — Encounter: Payer: Self-pay | Admitting: Family Medicine

## 2019-12-31 MED FILL — SIMVASTATIN 20 MG TABLET: 20 | 30 days supply | Qty: 30 | Fill #0

## 2019-12-31 MED FILL — METOPROLOL SUCCINATE ER 50: 50 | 30 days supply | Qty: 30 | Fill #0

## 2019-12-31 MED FILL — AMLODIPINE BESYLATE 10 MG T: 10 | 120 days supply | Qty: 120 | Fill #0

## 2020-01-30 MED FILL — METOPROLOL SUCCINATE ER 50: 50 | 30 days supply | Qty: 30 | Fill #1

## 2020-01-30 MED FILL — SIMVASTATIN 20 MG TABLET: 20 | 30 days supply | Qty: 30 | Fill #1

## 2020-03-04 MED FILL — SIMVASTATIN 20 MG TABLET: 20 | 30 days supply | Qty: 30 | Fill #2

## 2020-03-04 MED FILL — METOPROLOL SUCCINATE ER 50: 50 | 30 days supply | Qty: 30 | Fill #2

## 2020-04-06 MED FILL — METOPROLOL SUCCINATE ER 50: 50 | 30 days supply | Qty: 30 | Fill #3

## 2020-04-06 MED FILL — SIMVASTATIN 20 MG TABLET: 20 | 30 days supply | Qty: 30 | Fill #3

## 2020-04-19 ENCOUNTER — Other Ambulatory Visit: Payer: Self-pay | Admitting: Nurse Practitioner

## 2020-04-19 ENCOUNTER — Other Ambulatory Visit: Payer: Self-pay | Admitting: Internal Medicine

## 2020-04-19 DIAGNOSIS — I890 Lymphedema, not elsewhere classified: Secondary | ICD-10-CM

## 2020-04-19 DIAGNOSIS — E785 Hyperlipidemia, unspecified: Secondary | ICD-10-CM

## 2020-04-19 DIAGNOSIS — I10 Essential (primary) hypertension: Secondary | ICD-10-CM

## 2020-04-19 MED ORDER — METOPROLOL SUCCINATE ER 50 MG PO TB24: 50.0000 mg | ORAL_TABLET | Freq: Every day | ORAL | 0 refills | Status: DC

## 2020-04-19 MED ORDER — HYDROCHLOROTHIAZIDE 25 MG PO TABS: 25.0000 mg | ORAL_TABLET | Freq: Every day | ORAL | 0 refills | Status: DC

## 2020-04-19 MED ORDER — AMLODIPINE BESYLATE 10 MG PO TABS: ORAL_TABLET | ORAL | 0 refills | Status: DC

## 2020-04-19 MED ORDER — SIMVASTATIN 20 MG PO TABS: ORAL_TABLET | ORAL | 0 refills | Status: DC

## 2020-04-19 MED FILL — HYDROCHLOROTHIAZIDE 25 MG T: 25 | 30 days supply | Qty: 30 | Fill #0

## 2020-04-19 MED FILL — AMLODIPINE BESYLATE 10 MG T: 10 | 30 days supply | Qty: 30 | Fill #0

## 2020-04-19 NOTE — Telephone Encounter (Signed)
Copied from CRM [id number]. Topic: Quick Communication - Rx Refill/Question >> Apr 19, 2020  2:16 PM Jaquita Rector A wrote: Medication: simvastatin (ZOCOR) 20 MG tablet, metoprolol succinate (TOPROL-XL) 50 MG 24 hr tablet, amLODipine (NORVASC) 10 MG tablet,  hydrochlorothiazide (HYDRODIURIL) 25 MG tablet  Has the patient contacted their pharmacy? Yes.   (Agent: If no, request that the patient contact the pharmacy for the refill.) (Agent: If yes, when and what did the pharmacy advise?)  Preferred Pharmacy (with phone number or street name): [facility] - [place], [place] - [address]  Phone:  [phone] Fax:  [phone]     Agent: Please be advised that RX refills may take up to 3 business days. We ask that you follow-up with your pharmacy.

## 2020-04-19 NOTE — Telephone Encounter (Signed)
Patient is overdue lab - unable to give full RF- courtesy #[phone] given for appointment-06/07/20

## 2020-05-03 MED FILL — METOPROLOL SUCCINATE ER 50: 50 | 30 days supply | Qty: 30 | Fill #0

## 2020-05-03 MED FILL — SIMVASTATIN 20 MG TABLET: 20 | 30 days supply | Qty: 30 | Fill #0

## 2020-06-05 MED FILL — Simvastatin Tab 20 MG: ORAL | 30 days supply | Qty: 30 | Fill #0 | Status: AC

## 2020-06-05 MED FILL — Metoprolol Succinate Tab ER 24HR 50 MG (Tartrate Equiv): ORAL | 30 days supply | Qty: 30 | Fill #0 | Status: AC

## 2020-06-05 MED FILL — Amlodipine Besylate Tab 10 MG (Base Equivalent): ORAL | 30 days supply | Qty: 30 | Fill #0 | Status: AC

## 2020-06-07 ENCOUNTER — Other Ambulatory Visit: Payer: Self-pay

## 2020-06-07 ENCOUNTER — Ambulatory Visit: Payer: Self-pay | Attending: Nurse Practitioner | Admitting: Nurse Practitioner

## 2020-06-07 ENCOUNTER — Encounter: Payer: Self-pay | Admitting: Nurse Practitioner

## 2020-06-07 VITALS — BP 135/88 | HR 73 | Resp 18 | Ht 67.0 in | Wt 233.2 lb

## 2020-06-07 DIAGNOSIS — R7309 Other abnormal glucose: Secondary | ICD-10-CM

## 2020-06-07 DIAGNOSIS — E785 Hyperlipidemia, unspecified: Secondary | ICD-10-CM

## 2020-06-07 DIAGNOSIS — D582 Other hemoglobinopathies: Secondary | ICD-10-CM

## 2020-06-07 DIAGNOSIS — Z1211 Encounter for screening for malignant neoplasm of colon: Secondary | ICD-10-CM

## 2020-06-07 DIAGNOSIS — I10 Essential (primary) hypertension: Secondary | ICD-10-CM

## 2020-06-07 MED ORDER — AMLODIPINE BESYLATE 10 MG PO TABS
ORAL_TABLET | ORAL | 1 refills | Status: DC
  Filled 2020-06-07: qty 30, 30d supply, fill #0
  Filled 2020-07-04: qty 90, 90d supply, fill #0
  Filled 2020-09-24: qty 90, 90d supply, fill #1

## 2020-06-07 MED ORDER — SIMVASTATIN 20 MG PO TABS
ORAL_TABLET | ORAL | 1 refills | Status: DC
  Filled 2020-06-07: qty 90, fill #0
  Filled 2020-07-04: qty 90, 90d supply, fill #0
  Filled 2020-09-24: qty 90, 90d supply, fill #1

## 2020-06-07 MED ORDER — METOPROLOL SUCCINATE ER 50 MG PO TB24
ORAL_TABLET | Freq: Every day | ORAL | 1 refills | Status: DC
  Filled 2020-06-07: qty 90, fill #0
  Filled 2020-07-04: qty 90, 90d supply, fill #0
  Filled 2020-09-24: qty 90, 90d supply, fill #1

## 2020-06-07 NOTE — Progress Notes (Signed)
Assessment & Plan:  Brian Wade was seen today for medication refill.  Diagnoses and all orders for this visit:  Essential hypertension -     amLODipine (NORVASC) 10 MG tablet; TAKE 1 TABLET BY MOUTH DAILY TO CONTROL BLOOD PRESSURE -     metoprolol succinate (TOPROL-XL) 50 MG 24 hr tablet; TAKE 1 TABLET (50 MG TOTAL) BY MOUTH DAILY. TAKE WITH OR IMMEDIATELY FOLLOWING A MEAL. -     CMP14+EGFR Continue all antihypertensives as prescribed.  Remember to bring in your blood pressure log with you for your follow up appointment.  DASH/Mediterranean Diets are healthier choices for HTN.    Abnormal hemoglobin (HCC) -     CBC  Dyslipidemia with high LDL and low HDL -     simvastatin (ZOCOR) 20 MG tablet; TAKE 1 TABLET BY MOUTH ONCE DAILY AFTER THE EVENING MEAL TO LOWER CHOLESTEROL -     Lipid panel INSTRUCTIONS: Work on a low fat, heart healthy diet and participate in regular aerobic exercise program by working out at least 150 minutes per week; 5 days a week-30 minutes per day. Avoid red meat/beef/steak,  fried foods. junk foods, sodas, sugary drinks, unhealthy snacking, alcohol and smoking.  Drink at least 80 oz of water per day and monitor your carbohydrate intake daily.    Colon cancer screening -     Fecal occult blood, imunochemical([facility])  Elevated glucose -     Hemoglobin A1c    Patient has been counseled on age-appropriate routine health concerns for screening and prevention. These are reviewed and up-to-date. Referrals have been placed accordingly. Immunizations are up-to-date or declined.    Subjective:   Chief Complaint  Patient presents with  . Medication Refill   HPI Brian Wade 55 y.o. male presents to office today for follow up and medication refill She has a past medical history of Hyperlipidemia (2011), Hypertension (2010), and Lymphedema.  Essential Hypertension Well controlled. Taking amlodipine 10 mg daily and toprol xl 50 mg daily. He only takes HCTZ 25  mg every other day. He has lymphedema. Not wearing his compression stockings today. Denies chest pain, shortness of breath, palpitations, lightheadedness, dizziness, headaches or BLE edema.  BP Readings from Last 3 Encounters:  06/07/20 135/88  12/24/19 (!) 146/90  03/05/19 (!) 156/92    Dyslipidemia Lipids improved with zocor 20 mg daily. Denies any statin intolerance.  Lab Results  Component Value Date   CHOL 158 03/05/2019   CHOL 163 11/07/2017   CHOL 169 09/28/2016   Lab Results  Component Value Date   HDL 35 (L) 03/05/2019   HDL 29 (L) 11/07/2017   HDL 36 (L) 09/28/2016   Lab Results  Component Value Date   LDLCALC 100 (H) 03/05/2019   LDLCALC 87 11/07/2017   LDLCALC 117 (H) 09/28/2016   Lab Results  Component Value Date   TRIG 125 03/05/2019   TRIG 235 (H) 11/07/2017   TRIG 81 09/28/2016   Lab Results  Component Value Date   CHOLHDL 4.5 03/05/2019   CHOLHDL 5.6 (H) 11/07/2017   CHOLHDL 4.7 09/28/2016   Lab Results  Component Value Date   LDLDIRECT 83 10/26/2014   LDLDIRECT 129 (H) 12/09/2013   Review of Systems  Constitutional: Negative for fever, malaise/fatigue and weight loss.  HENT: Negative.  Negative for nosebleeds.   Eyes: Negative.  Negative for blurred vision, double vision and photophobia.  Respiratory: Negative.  Negative for cough and shortness of breath.   Cardiovascular: Positive for leg swelling (LLE lymphedema).  Negative for chest pain and palpitations.  Gastrointestinal: Negative.  Negative for heartburn, nausea and vomiting.  Musculoskeletal: Negative.  Negative for myalgias.  Neurological: Negative.  Negative for dizziness, focal weakness, seizures and headaches.  Psychiatric/Behavioral: Negative.  Negative for suicidal ideas.    Past Medical History:  Diagnosis Date  . Hyperlipidemia 2011  . Hypertension 2010  . Lymphedema    "qone on my mom's side have had problems w/lymphedema; mine started in 2005"    Past Surgical History:   Procedure Laterality Date  . NO PAST SURGERIES      Family History  Problem Relation Age of Onset  . Heart disease Mother   . Hypertension Mother   . Alzheimer's disease Mother   . Colon polyps Mother   . Asthma Sister   . Diabetes Maternal Aunt   . Cancer Maternal Grandmother   . Colon cancer Brother     Social History Reviewed with no changes to be made today.   Outpatient Medications Prior to Visit  Medication Sig Dispense Refill  . hydrochlorothiazide (HYDRODIURIL) 25 MG tablet TAKE 1 TABLET (25 MG TOTAL) BY MOUTH DAILY. AS NEEDED FOR LYMPHEDEMA/SWELLING 60 tablet 0  . ibuprofen (ADVIL,MOTRIN) 200 MG tablet Take 600 mg by mouth every 6 (six) hours as needed.    Marland Kitchen amLODipine (NORVASC) 10 MG tablet TAKE 1 TABLET BY MOUTH DAILY TO CONTROL BLOOD PRESSURE 60 tablet 0  . metoprolol succinate (TOPROL-XL) 50 MG 24 hr tablet TAKE 1 TABLET (50 MG TOTAL) BY MOUTH DAILY. TAKE WITH OR IMMEDIATELY FOLLOWING A MEAL. 60 tablet 0  . simvastatin (ZOCOR) 20 MG tablet TAKE 1 TABLET BY MOUTH ONCE DAILY AFTER THE EVENING MEAL TO LOWER CHOLESTEROL 60 tablet 0   No facility-administered medications prior to visit.    Allergies  Allergen Reactions  . Penicillins Hives and Swelling    Has patient had a PCN reaction causing immediate rash, facial/tongue/throat swelling, SOB or lightheadedness with hypotension: YES Has patient had a PCN reaction causing severe rash involving mucus membranes or skin necrosis: No Has patient had a PCN reaction that required hospitalization: Yes Has patient had a PCN reaction occurring within the last 10 years: No If all of the above answers are "NO", then may proceed with Cephalosporin use.       Objective:    BP 135/88   Pulse 73   Resp 18   Ht _0  (1.702 m)   Wt 233 lb 3.2 oz (105.8 kg)   SpO2 96%   BMI 36.52 kg/m  Wt Readings from Last 3 Encounters:  06/07/20 233 lb 3.2 oz (105.8 kg)  12/24/19 235 lb 9.6 oz (106.9 kg)  03/05/19 241 lb (109.3 kg)     Physical Exam Vitals and nursing note reviewed.  Constitutional:      Appearance: He is well-developed.  HENT:     Head: Normocephalic and atraumatic.  Cardiovascular:     Rate and Rhythm: Normal rate and regular rhythm.     Heart sounds: Normal heart sounds. No murmur heard. No friction rub. No gallop.   Pulmonary:     Effort: Pulmonary effort is normal. No tachypnea or respiratory distress.     Breath sounds: Normal breath sounds. No decreased breath sounds, wheezing, rhonchi or rales.  Chest:     Chest wall: No tenderness.  Abdominal:     General: Bowel sounds are normal.     Palpations: Abdomen is soft.  Musculoskeletal:        General: Normal  range of motion.     Cervical back: Normal range of motion.     Left lower leg: Edema present.  Skin:    General: Skin is warm and dry.  Neurological:     Mental Status: He is alert and oriented to person, place, and time.     Coordination: Coordination normal.  Psychiatric:        Behavior: Behavior normal. Behavior is cooperative.        Thought Content: Thought content normal.        Judgment: Judgment normal.          Patient has been counseled extensively about nutrition and exercise as well as the importance of adherence with medications and regular follow-up. The patient was given clear instructions to go to ER or return to medical center if symptoms don't improve, worsen or new problems develop. The patient verbalized understanding.   Follow-up: Return in about 6 months (around 12/07/2020).   Gildardo Pounds, FNP-BC [facility] and [place], [place]   06/07/2020, 4:06 PM

## 2020-06-08 LAB — CMP14+EGFR
ALT: 17 IU/L (ref 0–44)
AST: 18 IU/L (ref 0–40)
Albumin/Globulin Ratio: 1.3 (ref 1.2–2.2)
Albumin: 4.5 g/dL (ref 3.8–4.9)
Alkaline Phosphatase: 69 IU/L (ref 44–121)
BUN/Creatinine Ratio: 12 (ref 9–20)
BUN: 15 mg/dL (ref 6–24)
Bilirubin Total: 0.6 mg/dL (ref 0.0–1.2)
CO2: 22 mmol/L (ref 20–29)
Calcium: 9.1 mg/dL (ref 8.7–10.2)
Chloride: 103 mmol/L (ref 96–106)
Creatinine, Ser: 1.22 mg/dL (ref 0.76–1.27)
Globulin, Total: 3.5 g/dL (ref 1.5–4.5)
Glucose: 109 mg/dL — ABNORMAL HIGH (ref 65–99)
Potassium: 4.7 mmol/L (ref 3.5–5.2)
Sodium: 140 mmol/L (ref 134–144)
Total Protein: 8 g/dL (ref 6.0–8.5)
eGFR: 70 mL/min/{1.73_m2} (ref >59)

## 2020-06-08 LAB — CBC
Hematocrit: 42.5 % (ref 37.5–51.0)
Hemoglobin: 14.6 g/dL (ref 13.0–17.7)
MCH: 31.1 pg (ref 26.6–33.0)
MCHC: 34.4 g/dL (ref 31.5–35.7)
MCV: 90 fL (ref 79–97)
Platelets: 250 10*3/uL (ref 150–450)
RBC: 4.7 x10E6/uL (ref 4.14–5.80)
RDW: 12.3 % (ref 11.6–15.4)
WBC: 4.6 10*3/uL (ref 3.4–10.8)

## 2020-06-08 LAB — LIPID PANEL
Chol/HDL Ratio: 4.8 ratio (ref 0.0–5.0)
Cholesterol, Total: 148 mg/dL (ref 100–199)
HDL: 31 mg/dL — ABNORMAL LOW (ref >39)
LDL Chol Calc (NIH): 93 mg/dL (ref 0–99)
Triglycerides: 131 mg/dL (ref 0–149)
VLDL Cholesterol Cal: 24 mg/dL (ref 5–40)

## 2020-06-08 LAB — HEMOGLOBIN A1C
Est. average glucose Bld gHb Est-mCnc: 111 mg/dL
Hgb A1c MFr Bld: 5.5 % (ref 4.8–5.6)

## 2020-06-11 LAB — FECAL OCCULT BLOOD, IMMUNOCHEMICAL: Fecal Occult Bld: NEGATIVE

## 2020-07-05 ENCOUNTER — Other Ambulatory Visit: Payer: Self-pay

## 2020-07-06 ENCOUNTER — Other Ambulatory Visit: Payer: Self-pay

## 2020-09-24 ENCOUNTER — Other Ambulatory Visit: Payer: Self-pay

## 2020-12-29 ENCOUNTER — Other Ambulatory Visit: Payer: Self-pay

## 2020-12-29 ENCOUNTER — Ambulatory Visit: Payer: Self-pay | Attending: Physician Assistant | Admitting: Physician Assistant

## 2020-12-29 ENCOUNTER — Encounter: Payer: Self-pay | Admitting: Physician Assistant

## 2020-12-29 DIAGNOSIS — E785 Hyperlipidemia, unspecified: Secondary | ICD-10-CM

## 2020-12-29 DIAGNOSIS — I10 Essential (primary) hypertension: Secondary | ICD-10-CM

## 2020-12-29 DIAGNOSIS — I890 Lymphedema, not elsewhere classified: Secondary | ICD-10-CM

## 2020-12-29 MED ORDER — SIMVASTATIN 20 MG PO TABS
ORAL_TABLET | ORAL | 1 refills | Status: DC
Start: 2020-12-29 — End: 2021-07-04
  Filled 2020-12-29 – 2021-04-01 (×2): qty 90, 90d supply, fill #0
  Filled 2021-04-01: qty 90, 90d supply, fill #1

## 2020-12-29 MED ORDER — HYDROCHLOROTHIAZIDE 25 MG PO TABS
ORAL_TABLET | ORAL | 2 refills | Status: DC
  Filled 2020-12-29: qty 180, 90d supply, fill #0

## 2020-12-29 MED ORDER — METOPROLOL SUCCINATE ER 50 MG PO TB24
ORAL_TABLET | Freq: Every day | ORAL | 1 refills | Status: DC
  Filled 2020-12-29 – 2021-04-01 (×2): qty 90, 90d supply, fill #0
  Filled 2021-04-01: qty 90, 90d supply, fill #1

## 2020-12-29 MED ORDER — AMLODIPINE BESYLATE 10 MG PO TABS
ORAL_TABLET | ORAL | 1 refills | Status: DC
  Filled 2020-12-29 – 2021-04-01 (×2): qty 90, 90d supply, fill #0
  Filled 2021-04-01: qty 90, 90d supply, fill #1

## 2020-12-29 NOTE — Progress Notes (Signed)
Brian Wade, is a 55 y.o. male  HEN:[id number]  TIR:[id number]  DOB - 12/05/1965  Chief Complaint  Patient presents with   Hypertension       Subjective:   Brian Wade is a 55 y.o. male here today formed RF.  No issues or concerns.  No CP/SOB/HA/Dizziness.     No problems updated.  ALLERGIES: Allergies  Allergen Reactions   Penicillins Hives and Swelling    Has patient had a PCN reaction causing immediate rash, facial/tongue/throat swelling, SOB or lightheadedness with hypotension: YES Has patient had a PCN reaction causing severe rash involving mucus membranes or skin necrosis: No Has patient had a PCN reaction that required hospitalization: Yes Has patient had a PCN reaction occurring within the last 10 years: No If all of the above answers are "NO", then may proceed with Cephalosporin use.    PAST MEDICAL HISTORY: Past Medical History:  Diagnosis Date   Hyperlipidemia 2011   Hypertension 2010   Lymphedema    "qone on my mom's side have had problems w/lymphedema; mine started in 2005"    MEDICATIONS AT HOME: Prior to Admission medications   Medication Sig Start Date End Date Taking? Authorizing Provider  ibuprofen (ADVIL,MOTRIN) 200 MG tablet Take 600 mg by mouth every 6 (six) hours as needed.   Yes [provider]  amLODipine (NORVASC) 10 MG tablet TAKE 1 TABLET BY MOUTH DAILY TO CONTROL BLOOD PRESSURE 12/29/20 03/29/21  Anders Simmonds, PA-C  hydrochlorothiazide (HYDRODIURIL) 25 MG tablet TAKE 1 TABLET (25 MG TOTAL) BY MOUTH DAILY. AS NEEDED FOR LYMPHEDEMA/SWELLING 12/29/20 12/29/21  Anders Simmonds, PA-C  metoprolol succinate (TOPROL-XL) 50 MG 24 hr tablet TAKE 1 TABLET (50 MG TOTAL) BY MOUTH DAILY. TAKE WITH OR IMMEDIATELY FOLLOWING A MEAL. 12/29/20 03/29/21  Anders Simmonds, PA-C  simvastatin (ZOCOR) 20 MG tablet TAKE 1 TABLET BY MOUTH ONCE DAILY AFTER THE EVENING MEAL TO LOWER CHOLESTEROL 12/29/20 03/29/21  Verona Hartshorn, Marzella Schlein, PA-C    ROS: Neg  HEENT Neg resp Neg cardiac Neg GI Neg GU Neg MS Neg psych Neg neuro  Objective:   Vitals:   12/29/20 1403  BP: 130/84  Pulse: 79  Resp: 16  SpO2: 96%  Weight: 225 lb 3.2 oz (102.2 kg)   Exam General appearance : Awake, alert, not in any distress. Speech Clear. Not toxic looking HEENT: Atraumatic and Normocephalic Neck: Supple, no JVD. No cervical lymphadenopathy.  Chest: Good air entry bilaterally, CTAB.  No rales/rhonchi/wheezing CVS: S1 S2 regular, no murmurs.  Extremities: B/L Lower Ext shows no edema, both legs are warm to touch Neurology: Awake alert, and oriented X 3, CN II-XII intact, Non focal Skin: No Rash  Data Review Lab Results  Component Value Date   HGBA1C 5.5 06/07/2020   HGBA1C 5.5 03/05/2019   HGBA1C 5.0 09/28/2016    Assessment & Plan   1. Dyslipidemia with high LDL and low HDL - simvastatin (ZOCOR) 20 MG tablet; TAKE 1 TABLET BY MOUTH ONCE DAILY AFTER THE EVENING MEAL TO LOWER CHOLESTEROL  Dispense: 90 tablet; Refill: 1  2. Essential hypertension Controlled-last labs WNL - metoprolol succinate (TOPROL-XL) 50 MG 24 hr tablet; TAKE 1 TABLET (50 MG TOTAL) BY MOUTH DAILY. TAKE WITH OR IMMEDIATELY FOLLOWING A MEAL.  Dispense: 90 tablet; Refill: 1 - hydrochlorothiazide (HYDRODIURIL) 25 MG tablet; TAKE 1 TABLET (25 MG TOTAL) BY MOUTH DAILY. AS NEEDED FOR LYMPHEDEMA/SWELLING  Dispense: 60 tablet; Refill: 2 - amLODipine (NORVASC) 10 MG tablet; TAKE 1 TABLET BY  MOUTH DAILY TO CONTROL BLOOD PRESSURE  Dispense: 90 tablet; Refill: 1  3. Lymphedema of left leg (He does not use very often) - hydrochlorothiazide (HYDRODIURIL) 25 MG tablet; TAKE 1 TABLET (25 MG TOTAL) BY MOUTH DAILY. AS NEEDED FOR LYMPHEDEMA/SWELLING  Dispense: 60 tablet; Refill: 2    Patient have been counseled extensively about nutrition and exercise. Other issues discussed during this visit include: low cholesterol diet, weight control and daily exercise, foot care, annual eye examinations  at Ophthalmology, importance of adherence with medications and regular follow-up. We also discussed long term complications of uncontrolled diabetes and hypertension.   Return for 4 to 6 months;  please assign PCP.  The patient was given clear instructions to go to ER or return to medical center if symptoms don't improve, worsen or new problems develop. The patient verbalized understanding. The patient was told to call to get lab results if they haven't heard anything in the next week.      Georgian Co, PA-C [facility] and [facility] [place], [place] [phone]   12/29/2020, 2:30 PM

## 2020-12-30 ENCOUNTER — Other Ambulatory Visit: Payer: Self-pay

## 2021-04-01 ENCOUNTER — Other Ambulatory Visit: Payer: Self-pay

## 2021-04-06 ENCOUNTER — Other Ambulatory Visit: Payer: Self-pay

## 2021-07-04 ENCOUNTER — Other Ambulatory Visit: Payer: Self-pay | Admitting: Physician Assistant

## 2021-07-04 DIAGNOSIS — E785 Hyperlipidemia, unspecified: Secondary | ICD-10-CM

## 2021-07-04 DIAGNOSIS — I10 Essential (primary) hypertension: Secondary | ICD-10-CM

## 2021-07-05 ENCOUNTER — Other Ambulatory Visit: Payer: Self-pay

## 2021-07-05 MED ORDER — AMLODIPINE BESYLATE 10 MG PO TABS
ORAL_TABLET | ORAL | 0 refills | Status: DC
  Filled 2021-07-05: qty 30, 30d supply, fill #0

## 2021-07-05 MED ORDER — SIMVASTATIN 20 MG PO TABS
ORAL_TABLET | ORAL | 0 refills | Status: DC
  Filled 2021-07-05: qty 30, 30d supply, fill #0

## 2021-07-05 MED ORDER — METOPROLOL SUCCINATE ER 50 MG PO TB24
ORAL_TABLET | Freq: Every day | ORAL | 0 refills | Status: DC
  Filled 2021-07-05: qty 30, 30d supply, fill #0

## 2021-07-06 ENCOUNTER — Other Ambulatory Visit: Payer: Self-pay

## 2021-07-27 ENCOUNTER — Ambulatory Visit: Payer: Self-pay | Attending: Physician Assistant | Admitting: Physician Assistant

## 2021-07-27 ENCOUNTER — Encounter: Payer: Self-pay | Admitting: Physician Assistant

## 2021-07-27 ENCOUNTER — Other Ambulatory Visit: Payer: Self-pay

## 2021-07-27 VITALS — BP 133/73 | HR 61 | Wt 210.6 lb

## 2021-07-27 DIAGNOSIS — E785 Hyperlipidemia, unspecified: Secondary | ICD-10-CM

## 2021-07-27 DIAGNOSIS — I890 Lymphedema, not elsewhere classified: Secondary | ICD-10-CM

## 2021-07-27 DIAGNOSIS — M25562 Pain in left knee: Secondary | ICD-10-CM

## 2021-07-27 DIAGNOSIS — I10 Essential (primary) hypertension: Secondary | ICD-10-CM

## 2021-07-27 DIAGNOSIS — G5603 Carpal tunnel syndrome, bilateral upper limbs: Secondary | ICD-10-CM

## 2021-07-27 MED ORDER — METOPROLOL SUCCINATE ER 50 MG PO TB24
ORAL_TABLET | Freq: Every day | ORAL | 1 refills | Status: DC
  Filled 2021-07-27: qty 90, 90d supply, fill #0
  Filled 2021-10-24: qty 90, 90d supply, fill #1

## 2021-07-27 MED ORDER — SIMVASTATIN 20 MG PO TABS
ORAL_TABLET | ORAL | 1 refills | Status: DC
  Filled 2021-07-27: qty 90, 90d supply, fill #0
  Filled 2021-10-24: qty 90, 90d supply, fill #1

## 2021-07-27 MED ORDER — AMLODIPINE BESYLATE 10 MG PO TABS
ORAL_TABLET | ORAL | 1 refills | Status: DC
  Filled 2021-07-27: qty 90, 90d supply, fill #0
  Filled 2021-10-24: qty 90, 90d supply, fill #1

## 2021-07-27 MED ORDER — HYDROCHLOROTHIAZIDE 25 MG PO TABS
ORAL_TABLET | ORAL | 2 refills | Status: DC
  Filled 2021-07-27: qty 90, 90d supply, fill #0

## 2021-07-27 NOTE — Progress Notes (Signed)
Patient ID: Brian Wade, male   DOB: 02/02/1966, 56 y.o.   MRN: [id number]   Brian Wade, is a 56 y.o. male  VZD:[id number]  IRJ:[id number]  DOB - 1965/04/28  Chief Complaint  Patient presents with   Medication Refill       Subjective:   Brian Wade is a 56 y.o. male here today for med RF.  Also L knee pain for a few months.  Seems worse when he has more swelling but admits to not taking his HCTZ like he should.  The joint in the L knee is bothering him.  He lives in a 2 story townhome and it is bothering him more and more to go up and down stairs.    He is also having B carpal tunnel pain with paresthesias in B thumbs, index, and middle fingers.  He has wrist splints but has not been sleeping in them.  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  Compliant with Bp meds.  No CP/HA/dizziness   No problems updated.  ALLERGIES: Allergies  Allergen Reactions   Penicillins Hives and Swelling    Has patient had a PCN reaction causing immediate rash, facial/tongue/throat swelling, SOB or lightheadedness with hypotension: YES Has patient had a PCN reaction causing severe rash involving mucus membranes or skin necrosis: No Has patient had a PCN reaction that required hospitalization: Yes Has patient had a PCN reaction occurring within the last 10 years: No If all of the above answers are "NO", then may proceed with Cephalosporin use.    PAST MEDICAL HISTORY: Past Medical History:  Diagnosis Date   Hyperlipidemia 2011   Hypertension 2010   Lymphedema    "qone on my mom's side have had problems w/lymphedema; mine started in 2005"    MEDICATIONS AT HOME: Prior to Admission medications   Medication Sig Start Date End Date Taking? Authorizing Provider  ibuprofen (ADVIL,MOTRIN) 200 MG tablet Take 600 mg by mouth every 6 (six) hours as needed.   Yes [provider]  amLODipine (NORVASC) 10 MG tablet TAKE 1 TABLET BY MOUTH DAILY TO CONTROL BLOOD PRESSURE  07/27/21 10/25/21  Anders Simmonds, PA-C  hydrochlorothiazide (HYDRODIURIL) 25 MG tablet TAKE 1 TABLET (25 MG TOTAL) BY MOUTH DAILY. AS NEEDED FOR LYMPHEDEMA/SWELLING 07/27/21 07/27/22  Anders Simmonds, PA-C  metoprolol succinate (TOPROL-XL) 50 MG 24 hr tablet TAKE 1 TABLET (50 MG TOTAL) BY MOUTH DAILY. TAKE WITH OR IMMEDIATELY FOLLOWING A MEAL. 07/27/21 10/25/21  Anders Simmonds, PA-C  simvastatin (ZOCOR) 20 MG tablet TAKE 1 TABLET BY MOUTH ONCE DAILY AFTER THE EVENING MEAL TO LOWER CHOLESTEROL 07/27/21 10/25/21  Bud Kaeser, Marzella Schlein, PA-C    ROS: Neg HEENT Neg resp Neg cardiac Neg GI Neg GU Neg psych Neg neuro  Objective:   Vitals:   07/27/21 1521  BP: 133/73  Pulse: 61  SpO2: 98%  Weight: 210 lb 9.6 oz (95.5 kg)   Exam General appearance : Awake, alert, not in any distress. Speech Clear. Not toxic looking HEENT: Atraumatic and Normocephalic Neck: Supple, no JVD. No cervical lymphadenopathy.  Chest: Good air entry bilaterally, CTAB.  No rales/rhonchi/wheezing CVS: S1 S2 regular, no murmurs.  L knee stable-no ballotment or obvious effusion.  1+ edema.  DP intact Extremities: B/L Lower Ext shows no edema, both legs are warm to touch Neurology: Awake alert, and oriented X 3, CN II-XII intact, Non focal Skin: No Rash  Data Review Lab Results  Component Value Date  HGBA1C 5.5 06/07/2020   HGBA1C 5.5 03/05/2019   HGBA1C 5.0 09/28/2016    Assessment & Plan   1. Essential hypertension controlled - Comprehensive metabolic panel - CBC with Differential/Platelet - hydrochlorothiazide (HYDRODIURIL) 25 MG tablet; TAKE 1 TABLET (25 MG TOTAL) BY MOUTH DAILY. AS NEEDED FOR LYMPHEDEMA/SWELLING  Dispense: 90 tablet; Refill: 2 - amLODipine (NORVASC) 10 MG tablet; TAKE 1 TABLET BY MOUTH DAILY TO CONTROL BLOOD PRESSURE  Dispense: 90 tablet; Refill: 1 - metoprolol succinate (TOPROL-XL) 50 MG 24 hr tablet; TAKE 1 TABLET (50 MG TOTAL) BY MOUTH DAILY. TAKE WITH OR IMMEDIATELY FOLLOWING A  MEAL.  Dispense: 90 tablet; Refill: 1  2. Lymphedema of left leg Use HCTZ, compression stockings - Comprehensive metabolic panel - CBC with Differential/Platelet - hydrochlorothiazide (HYDRODIURIL) 25 MG tablet; TAKE 1 TABLET (25 MG TOTAL) BY MOUTH DAILY. AS NEEDED FOR LYMPHEDEMA/SWELLING  Dispense: 90 tablet; Refill: 2  3. Dyslipidemia with high LDL and low HDL - Lipid panel - simvastatin (ZOCOR) 20 MG tablet; TAKE 1 TABLET BY MOUTH ONCE DAILY AFTER THE EVENING MEAL TO LOWER CHOLESTEROL  Dispense: 90 tablet; Refill: 1  4. Pain in left knee - Ambulatory referral to Orthopedic Surgery  5. Bilateral carpal tunnel syndrome Wear B wrist splints at night - Ambulatory referral to Orthopedic Surgery    Return for 6 months assign new PCP.  The patient was given clear instructions to go to ER or return to medical center if symptoms don't improve, worsen or new problems develop. The patient verbalized understanding. The patient was told to call to get lab results if they haven't heard anything in the next week.      Georgian Co, PA-C [facility] and [facility] [place], [place] [phone]   07/27/2021, 3:37 PM

## 2021-07-28 LAB — CBC WITH DIFFERENTIAL/PLATELET
Basophils Absolute: 0.1 10*3/uL (ref 0.0–0.2)
Basos: 1 %
EOS (ABSOLUTE): 0.3 10*3/uL (ref 0.0–0.4)
Eos: 6 %
Hematocrit: 38 % (ref 37.5–51.0)
Hemoglobin: 13.1 g/dL (ref 13.0–17.7)
Immature Grans (Abs): 0 10*3/uL (ref 0.0–0.1)
Immature Granulocytes: 0 %
Lymphocytes Absolute: 2.7 10*3/uL (ref 0.7–3.1)
Lymphs: 52 %
MCH: 31.7 pg (ref 26.6–33.0)
MCHC: 34.5 g/dL (ref 31.5–35.7)
MCV: 92 fL (ref 79–97)
Monocytes Absolute: 0.5 10*3/uL (ref 0.1–0.9)
Monocytes: 9 %
Neutrophils Absolute: 1.7 10*3/uL (ref 1.4–7.0)
Neutrophils: 32 %
Platelets: 237 10*3/uL (ref 150–450)
RBC: 4.13 x10E6/uL — ABNORMAL LOW (ref 4.14–5.80)
RDW: 12.3 % (ref 11.6–15.4)
WBC: 5.3 10*3/uL (ref 3.4–10.8)

## 2021-07-28 LAB — COMPREHENSIVE METABOLIC PANEL
ALT: 15 IU/L (ref 0–44)
AST: 19 IU/L (ref 0–40)
Albumin/Globulin Ratio: 1.4 (ref 1.2–2.2)
Albumin: 4.2 g/dL (ref 3.8–4.9)
Alkaline Phosphatase: 88 IU/L (ref 44–121)
BUN/Creatinine Ratio: 11 (ref 9–20)
BUN: 10 mg/dL (ref 6–24)
Bilirubin Total: 0.2 mg/dL (ref 0.0–1.2)
CO2: 22 mmol/L (ref 20–29)
Calcium: 9 mg/dL (ref 8.7–10.2)
Chloride: 106 mmol/L (ref 96–106)
Creatinine, Ser: 0.95 mg/dL (ref 0.76–1.27)
Globulin, Total: 2.9 g/dL (ref 1.5–4.5)
Glucose: 87 mg/dL (ref 70–99)
Potassium: 3.7 mmol/L (ref 3.5–5.2)
Sodium: 141 mmol/L (ref 134–144)
Total Protein: 7.1 g/dL (ref 6.0–8.5)
eGFR: 93 mL/min/{1.73_m2} (ref >59)

## 2021-07-28 LAB — LIPID PANEL
Chol/HDL Ratio: 2.9 ratio (ref 0.0–5.0)
Cholesterol, Total: 124 mg/dL (ref 100–199)
HDL: 43 mg/dL (ref >39)
LDL Chol Calc (NIH): 65 mg/dL (ref 0–99)
Triglycerides: 80 mg/dL (ref 0–149)
VLDL Cholesterol Cal: 16 mg/dL (ref 5–40)

## 2021-08-01 ENCOUNTER — Other Ambulatory Visit: Payer: Self-pay

## 2021-08-16 ENCOUNTER — Ambulatory Visit (INDEPENDENT_AMBULATORY_CARE_PROVIDER_SITE_OTHER): Payer: Self-pay | Admitting: Orthopaedic Surgery

## 2021-08-16 ENCOUNTER — Other Ambulatory Visit: Payer: Self-pay

## 2021-08-16 ENCOUNTER — Ambulatory Visit (INDEPENDENT_AMBULATORY_CARE_PROVIDER_SITE_OTHER): Payer: Self-pay

## 2021-08-16 DIAGNOSIS — M1712 Unilateral primary osteoarthritis, left knee: Secondary | ICD-10-CM

## 2021-08-16 DIAGNOSIS — R202 Paresthesia of skin: Secondary | ICD-10-CM

## 2021-08-16 MED ORDER — DICLOFENAC SODIUM 75 MG PO TBEC
75.0000 mg | DELAYED_RELEASE_TABLET | Freq: Two times a day (BID) | ORAL | 2 refills | Status: DC | PRN
  Filled 2021-08-16: qty 60, 30d supply, fill #0
  Filled 2021-10-13: qty 60, 30d supply, fill #1
  Filled 2021-12-07: qty 60, 30d supply, fill #2

## 2021-08-16 NOTE — Progress Notes (Unsigned)
Office Visit Note   Patient: Brian Wade           Date of Birth: 11-Sep-1965           MRN: [id number] Visit Date: 08/16/2021              Requested by: Anders Simmonds, PA-C [address] [place],  [place] [address] PCP: Cain Saupe, MD   Assessment & Plan: Visit Diagnoses:  1. Primary osteoarthritis of left knee   2. Paresthesia of hand, bilateral     Plan: Impression is left knee osteoarthritis, left ring and right long trigger fingers and bilateral carpal tunnel syndrome.  In regards to the knee and trigger fingers, I discussed cortisone injection.  He would like to hold off for now.  He would like to try a course of NSAIDs first for which I am agreeable to although I did discuss that I did not think this would help with the trigger fingers.  In regards to the probable carpal tunnel syndrome, would like to make referral to Dr. Alvester Morin for nerve conduction study/EMG both upper extremities.  He will follow-up with [place] once this is been completed.  Call with concerns or questions.  Follow-Up Instructions: Return for after NCS/EMG.   Orders:  Orders Placed This Encounter  Procedures   XR KNEE 3 VIEW LEFT   Meds ordered this encounter  Medications   diclofenac (VOLTAREN) 75 MG EC tablet    Sig: Take 1 tablet (75 mg total) by mouth 2 (two) times daily as needed.    Dispense:  60 tablet    Refill:  2      Procedures: No procedures performed   Clinical Data: No additional findings.   Subjective: Chief Complaint  Patient presents with   Left Knee - Pain   Right Hand - Pain   Left Hand - Pain    HPI patient is a pleasant 56 year old gentleman who comes in today with left knee pain and bilateral hand pain and paresthesias.  In regards to his knee, his pain has been ongoing for the past 5 to 6 weeks after starting a job at [employer].  He denies any specific injury.  The pain he has is to the entire knee and is worse after working a 5 to 6-hour shift as well  as at night when he is trying to sleep.  He has been taking Tylenol and occasional Excedrin with moderate relief.  No previous cortisone injection.  In regards to his hands, he is getting pain and triggering to the left ring and right long fingers.  He is also complaining of paresthesias throughout the median nerve distribution of the hand.  No previous cortisone injections to the trigger fingers or carpal tunnel.  No previous nerve conduction study.  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  He has tried carpal tunnel bracing which occasionally help.  Review of Systems as detailed in HPI.  All other reviewed and are negative.   Objective: Vital Signs: There were no vitals taken for this visit.  Physical Exam well-developed well-nourished gentleman in no acute distress.  Alert and oriented x3.  Ortho Exam left knee exam shows a trace effusion.  Does have lymphedema to the left lower extremity.  He has medial joint line tenderness and moderate patellofemoral crepitus.  Left hand exam reveals tenderness to the A1 pulley of the ring finger  without a palpable nodule.  He does have reproducible triggering.  Right hand exam shows triggering to the long finger.  He does have tenderness to the A1 pulley.  Negative Phalen and negative Tinel.  No thenar atrophy.  Specialty Comments:  No specialty comments available.  Imaging: XR KNEE 3 VIEW LEFT  Result Date: 08/16/2021 X-rays demonstrate moderate medial and patellofemoral degenerative changes    PMFS History: Patient Active Problem List   Diagnosis Date Noted   Sepsis (HCC) 01/15/2017   Cellulitis of left lower extremity 01/15/2017   Gingival swelling 09/28/2016   Lymphedema of left leg 09/28/2016   HTN (hypertension) 02/18/2013   HLD (hyperlipidemia) 02/18/2013   Past Medical History:  Diagnosis Date   Hyperlipidemia 2011   Hypertension 2010   Lymphedema    "qone on my  mom's side have had problems w/lymphedema; mine started in 2005"    Family History  Problem Relation Age of Onset   Heart disease Mother    Hypertension Mother    Alzheimer's disease Mother    Colon polyps Mother    Asthma Sister    Diabetes Maternal Aunt    Cancer Maternal Grandmother    Colon cancer Brother     Past Surgical History:  Procedure Laterality Date   NO PAST SURGERIES     Social History   Occupational History   Not on file  Tobacco Use   Smoking status: Never   Smokeless tobacco: Never  Vaping Use   Vaping Use: Never used  Substance and Sexual Activity   Alcohol use: No   Drug use: No   Sexual activity: Not Currently

## 2021-09-15 ENCOUNTER — Emergency Department (HOSPITAL_COMMUNITY)
Admission: EM | Admit: 2021-09-15 | Discharge: 2021-09-16 | Discharge status: Against Medical Advice | Payer: Self-pay | Attending: Emergency Medicine | Admitting: Emergency Medicine

## 2021-09-15 ENCOUNTER — Other Ambulatory Visit: Payer: Self-pay

## 2021-09-15 DIAGNOSIS — B353 Tinea pedis: Secondary | ICD-10-CM | POA: Insufficient documentation

## 2021-09-15 DIAGNOSIS — Z5321 Procedure and treatment not carried out due to patient leaving prior to being seen by health care provider: Secondary | ICD-10-CM | POA: Insufficient documentation

## 2021-09-15 LAB — CBC WITH DIFFERENTIAL/PLATELET
Abs Immature Granulocytes: 0.01 10*3/uL (ref 0.00–0.07)
Basophils Absolute: 0.1 10*3/uL (ref 0.0–0.1)
Basophils Relative: 1 %
Eosinophils Absolute: 0.3 10*3/uL (ref 0.0–0.5)
Eosinophils Relative: 6 %
HCT: 38.9 % — ABNORMAL LOW (ref 39.0–52.0)
Hemoglobin: 12.9 g/dL — ABNORMAL LOW (ref 13.0–17.0)
Immature Granulocytes: 0 %
Lymphocytes Relative: 39 %
Lymphs Abs: 2.2 10*3/uL (ref 0.7–4.0)
MCH: 31.2 pg (ref 26.0–34.0)
MCHC: 33.2 g/dL (ref 30.0–36.0)
MCV: 94.2 fL (ref 80.0–100.0)
Monocytes Absolute: 0.5 10*3/uL (ref 0.1–1.0)
Monocytes Relative: 9 %
Neutro Abs: 2.6 10*3/uL (ref 1.7–7.7)
Neutrophils Relative %: 45 %
Platelets: 236 10*3/uL (ref 150–400)
RBC: 4.13 MIL/uL — ABNORMAL LOW (ref 4.22–5.81)
RDW: 12.2 % (ref 11.5–15.5)
WBC: 5.8 10*3/uL (ref 4.0–10.5)
nRBC: 0 % (ref 0.0–0.2)

## 2021-09-15 LAB — BASIC METABOLIC PANEL
Anion gap: 8 (ref 5–15)
BUN: 18 mg/dL (ref 6–20)
CO2: 22 mmol/L (ref 22–32)
Calcium: 8.9 mg/dL (ref 8.9–10.3)
Chloride: 110 mmol/L (ref 98–111)
Creatinine, Ser: 1.1 mg/dL (ref 0.61–1.24)
GFR, Estimated: >60 mL/min (ref >60)
Glucose, Bld: 109 mg/dL — ABNORMAL HIGH (ref 70–99)
Potassium: 3.7 mmol/L (ref 3.5–5.1)
Sodium: 140 mmol/L (ref 135–145)

## 2021-09-15 NOTE — ED Provider Triage Note (Signed)
Emergency Medicine Provider Triage Evaluation Note  Brian Wade , a 56 y.o. male  was evaluated in triage.  Pt complains of athlete's foot to the left foot since end of May.  He states since Tuesday he has noticed erythema progressing up the left lower extremity.  Patient also has history of lymphedema in the left lower extremity.  He denies any change in the swelling.  He denies fever.  Denies recent travel, recent surgery.  Denies history of blood clots.  Review of Systems  Positive: As above Negative: As above  Physical Exam  BP 130/81 (BP Location: Left Arm)   Pulse 75   Temp 98.3 F (36.8 C) (Oral)   Resp 18   SpO2 98%  Gen:   Awake, no distress   Resp:  Normal effort  MSK:   Moves extremities without difficulty  Other:  Significant erythema present to forefoot.  Minimal erythema noted proximal to this associated with warmth.  Medical Decision Making  Medically screening exam initiated at 3:46 PM.  Appropriate orders placed.  Praise Dolecki was informed that the remainder of the evaluation will be completed by another provider, this initial triage assessment does not replace that evaluation, and the importance of remaining in the ED until their evaluation is complete.     Marita Kansas, PA-C 09/15/21 [phone]

## 2021-09-15 NOTE — ED Triage Notes (Signed)
Pt here for LLE swelling since getting athlete's foot at the end of may. Pt tried to treat it on his own but is not healing, pt has significant redness and swelling to L foot, with swelling up to calf. Pt does have hx of lymphedema in L leg. Pt reports hx of cellulitis w/ sepsis in 2018. Denies fevers/chills.

## 2021-10-13 ENCOUNTER — Other Ambulatory Visit: Payer: Self-pay

## 2021-10-14 ENCOUNTER — Other Ambulatory Visit: Payer: Self-pay

## 2021-10-24 ENCOUNTER — Other Ambulatory Visit: Payer: Self-pay

## 2021-10-25 ENCOUNTER — Other Ambulatory Visit: Payer: Self-pay

## 2021-12-08 ENCOUNTER — Other Ambulatory Visit: Payer: Self-pay

## 2021-12-12 ENCOUNTER — Other Ambulatory Visit: Payer: Self-pay

## 2022-01-22 ENCOUNTER — Other Ambulatory Visit: Payer: Self-pay | Admitting: Physician Assistant

## 2022-01-22 DIAGNOSIS — E785 Hyperlipidemia, unspecified: Secondary | ICD-10-CM

## 2022-01-22 DIAGNOSIS — I10 Essential (primary) hypertension: Secondary | ICD-10-CM

## 2022-01-24 MED ORDER — SIMVASTATIN 20 MG PO TABS
20.0000 mg | ORAL_TABLET | Freq: Every evening | ORAL | 0 refills | Status: DC
  Filled 2022-01-24: qty 30, 30d supply, fill #0

## 2022-01-24 MED ORDER — METOPROLOL SUCCINATE ER 50 MG PO TB24
50.0000 mg | ORAL_TABLET | Freq: Every day | ORAL | 0 refills | Status: DC
  Filled 2022-01-24: qty 30, 30d supply, fill #0

## 2022-01-24 MED ORDER — AMLODIPINE BESYLATE 10 MG PO TABS
10.0000 mg | ORAL_TABLET | Freq: Every day | ORAL | 0 refills | Status: DC
  Filled 2022-01-24: qty 30, 30d supply, fill #0

## 2022-01-24 NOTE — Telephone Encounter (Signed)
Requested medications are due for refill today.  yes  Requested medications are on the active medications list.  yes  Last refill. All 3 refilled 07/27/2021 #90 1 rf  Future visit scheduled.   yes  Notes to clinic.  Cammie Fulp listed as PCP.    Requested Prescriptions  Pending Prescriptions Disp Refills   amLODipine (NORVASC) 10 MG tablet 90 tablet 1    Sig: TAKE 1 TABLET BY MOUTH DAILY TO CONTROL BLOOD PRESSURE     Cardiovascular: Calcium Channel Blockers 2 Passed - 01/22/2022  9:03 AM      Passed - Last BP in normal range    BP Readings from Last 1 Encounters:  09/15/21 117/83         Passed - Last Heart Rate in normal range    Pulse Readings from Last 1 Encounters:  09/15/21 61         Passed - Valid encounter within last 6 months    Recent Outpatient Visits           6 months ago Pain in lateral portion of left knee   [facility] [place], [place], [place]   1 year ago Dyslipidemia with high LDL and low HDL   [facility] [place], [place], [place]   1 year ago Essential hypertension   [facility] [address] [facility] [place], [place] W, NP   2 years ago Dyslipidemia with high LDL and low HDL   [facility] Fulp, [place], [place]   2 years ago Essential hypertension   [facility] [place], [place], [place]       Future Appointments             In 2 days Storm Frisk, MD [facility]             metoprolol succinate (TOPROL-XL) 50 MG 24 hr tablet 90 tablet 1    Sig: TAKE 1 TABLET (50 MG TOTAL) BY MOUTH DAILY. TAKE WITH OR IMMEDIATELY FOLLOWING A MEAL.     Cardiovascular:  Beta Blockers Passed - 01/22/2022  9:03 AM      Passed - Last BP in normal range    BP Readings from Last 1 Encounters:  09/15/21 117/83         Passed - Last Heart Rate in normal range    Pulse Readings from Last 1 Encounters:  09/15/21 61          Passed - Valid encounter within last 6 months    Recent Outpatient Visits           6 months ago Pain in lateral portion of left knee   [facility] [place], [place], [place]   1 year ago Dyslipidemia with high LDL and low HDL   [facility] [place], [place], [place]   1 year ago Essential hypertension   [facility] [address] [facility] [place], [place] W, NP   2 years ago Dyslipidemia with high LDL and low HDL   [place] [facility] Fulp, [place], MD   2 years ago Essential hypertension    [facility] [place], [place] Fick, NP       Future Appointments             In 2 days Storm Frisk, MD [facility]             simvastatin (ZOCOR) 20 MG tablet 90 tablet 1    Sig: TAKE 1 TABLET BY MOUTH ONCE DAILY AFTER THE EVENING MEAL TO LOWER CHOLESTEROL     Cardiovascular:  Antilipid - Statins Failed - 01/22/2022  9:03 AM      Failed - Lipid Panel in normal range within the last 12 months    Cholesterol, Total  Date Value Ref Range Status  07/27/2021 124 100 - 199 mg/dL Final   LDL Chol Calc (NIH)  Date Value Ref Range Status  07/27/2021 65 0 - 99 mg/dL Final   Direct LDL  Date Value Ref Range Status  10/26/2014 83 <130 mg/dL Final    Comment:    ATP III Classification (LDL):       < 100        mg/dL         Optimal      563 - 129     mg/dL         Near or Above Optimal      130 - 159     mg/dL         Borderline High      160 - 189     mg/dL         High       > 149        mg/dL         Very High   ** Please note change in unit of measure and reference range(s). **      HDL  Date Value Ref Range Status  07/27/2021 43 >39 mg/dL Final   Triglycerides  Date Value Ref Range Status  07/27/2021 80 0 - 149 mg/dL Final         Passed - Patient is not pregnant      Passed - Valid encounter within last 12 months     Recent Outpatient Visits           6 months ago Pain in lateral portion of left knee   [facility] [place], [place], [place]   1 year ago Dyslipidemia with high LDL and low HDL   [facility] [place], [place], [place]   1 year ago Essential hypertension   [place] [address] [facility] [place], [place] W, NP   2 years ago Dyslipidemia with high LDL and low HDL   [place] [facility] Fulp, [place], [place]   2 years ago Essential hypertension   [facility] [place], [place], [place]       Future Appointments             In 2 days Storm Frisk, MD [facility]

## 2022-01-25 ENCOUNTER — Other Ambulatory Visit: Payer: Self-pay

## 2022-01-26 ENCOUNTER — Ambulatory Visit: Payer: Self-pay | Attending: Critical Care Medicine | Admitting: Critical Care Medicine

## 2022-01-26 ENCOUNTER — Other Ambulatory Visit: Payer: Self-pay

## 2022-01-26 ENCOUNTER — Encounter: Payer: Self-pay | Admitting: Critical Care Medicine

## 2022-01-26 VITALS — BP 116/72 | HR 52 | Ht 67.0 in | Wt 206.6 lb

## 2022-01-26 DIAGNOSIS — B353 Tinea pedis: Secondary | ICD-10-CM

## 2022-01-26 DIAGNOSIS — I10 Essential (primary) hypertension: Secondary | ICD-10-CM

## 2022-01-26 DIAGNOSIS — I890 Lymphedema, not elsewhere classified: Secondary | ICD-10-CM

## 2022-01-26 DIAGNOSIS — E7800 Pure hypercholesterolemia, unspecified: Secondary | ICD-10-CM

## 2022-01-26 DIAGNOSIS — E785 Hyperlipidemia, unspecified: Secondary | ICD-10-CM

## 2022-01-26 MED ORDER — HYDROCHLOROTHIAZIDE 25 MG PO TABS
ORAL_TABLET | ORAL | 2 refills | Status: DC
  Filled 2022-01-26: qty 90, 90d supply, fill #0

## 2022-01-26 MED ORDER — AMLODIPINE BESYLATE 10 MG PO TABS
10.0000 mg | ORAL_TABLET | Freq: Every day | ORAL | 2 refills | Status: DC
  Filled 2022-01-26 – 2022-02-20 (×2): qty 90, 90d supply, fill #0
  Filled 2022-05-23: qty 90, 90d supply, fill #1

## 2022-01-26 MED ORDER — DICLOFENAC SODIUM 75 MG PO TBEC
75.0000 mg | DELAYED_RELEASE_TABLET | Freq: Two times a day (BID) | ORAL | 2 refills | Status: DC | PRN
  Filled 2022-01-26: qty 60, 30d supply, fill #0
  Filled 2022-04-10: qty 60, 30d supply, fill #1
  Filled 2022-06-07: qty 60, 30d supply, fill #2

## 2022-01-26 MED ORDER — SIMVASTATIN 20 MG PO TABS
20.0000 mg | ORAL_TABLET | Freq: Every evening | ORAL | 0 refills | Status: DC
  Filled 2022-01-26 – 2022-02-20 (×2): qty 30, 30d supply, fill #0

## 2022-01-26 MED ORDER — METOPROLOL SUCCINATE ER 50 MG PO TB24
50.0000 mg | ORAL_TABLET | Freq: Every day | ORAL | 0 refills | Status: DC
  Filled 2022-01-26 – 2022-02-21 (×3): qty 30, 30d supply, fill #0

## 2022-01-26 MED ORDER — CLOTRIMAZOLE-BETAMETHASONE 1-0.05 % EX CREA
1.0000 | TOPICAL_CREAM | Freq: Two times a day (BID) | CUTANEOUS | 0 refills | Status: DC
  Filled 2022-01-26: qty 45, 23d supply, fill #0

## 2022-01-26 NOTE — Assessment & Plan Note (Signed)
Patient given lifestyle medicine handout we will maintain statins reassess lipids

## 2022-01-26 NOTE — Progress Notes (Signed)
New Patient Office Visit  Subjective    Patient ID: Brian Wade, male    DOB: 06/07/1965  Age: 56 y.o. MRN: [id number]  CC:  Chief Complaint  Patient presents with   Establish Care    HPI Brian Wade presents to establish care This patient is a former primary care patient of Dr. Jillyn Wade 56 year old male with chronic lymphedema left lower extremity chronic hypertension chronic knee pain athlete's foot.  Patient arrives with a blood pressure 116/72 well-controlled mild bradycardia pulse rate 52 without symptoms.  He has chronic swelling left lower extremity for years and uses a stocking support and has come in complaining of some athletes feet changes to the left dorsum of the foot Patient was last seen in May of this year with normal labs and does maintain therapy for lipids along with hypertension and was given Voltaren orally for knee pain.  The pain in the knee still persist he would like a refill on the Voltaren  Patient also has carpal tunnel symptoms would like orthopedic evaluation   Outpatient Encounter Medications as of 01/26/2022  Medication Sig   clotrimazole-betamethasone (LOTRISONE) cream Apply 1 Application topically 2 (two) times daily. To affected areas   ibuprofen (ADVIL,MOTRIN) 200 MG tablet Take 600 mg by mouth every 6 (six) hours as needed.   [DISCONTINUED] amLODipine (NORVASC) 10 MG tablet TAKE 1 TABLET BY MOUTH DAILY TO CONTROL BLOOD PRESSURE   [DISCONTINUED] diclofenac (VOLTAREN) 75 MG EC tablet Take 1 tablet (75 mg total) by mouth 2 (two) times daily as needed.   [DISCONTINUED] hydrochlorothiazide (HYDRODIURIL) 25 MG tablet TAKE 1 TABLET (25 MG TOTAL) BY MOUTH DAILY. AS NEEDED FOR LYMPHEDEMA/SWELLING   [DISCONTINUED] metoprolol succinate (TOPROL-XL) 50 MG 24 hr tablet TAKE 1 TABLET (50 MG TOTAL) BY MOUTH DAILY,  TAKE WITH OR IMMEDIATELY FOLLOWING A MEAL (Must keep upcoming office visit for refills)   [DISCONTINUED] simvastatin (ZOCOR) 20 MG tablet TAKE 1 TABLET BY  MOUTH ONCE DAILY AFTER THE EVENING MEAL TO LOWER CHOLESTEROL (Must keep upcoming office visit for refills)   amLODipine (NORVASC) 10 MG tablet TAKE 1 TABLET BY MOUTH DAILY TO CONTROL BLOOD PRESSURE   diclofenac (VOLTAREN) 75 MG EC tablet Take 1 tablet (75 mg total) by mouth 2 (two) times daily as needed.   hydrochlorothiazide (HYDRODIURIL) 25 MG tablet TAKE 1 TABLET (25 MG TOTAL) BY MOUTH DAILY. AS NEEDED FOR LYMPHEDEMA/SWELLING   metoprolol succinate (TOPROL-XL) 50 MG 24 hr tablet TAKE 1 TABLET (50 MG TOTAL) BY MOUTH DAILY,  TAKE WITH OR IMMEDIATELY FOLLOWING A MEAL (Must keep upcoming office visit for refills)   simvastatin (ZOCOR) 20 MG tablet TAKE 1 TABLET BY MOUTH ONCE DAILY AFTER THE EVENING MEAL TO LOWER CHOLESTEROL (Must keep upcoming office visit for refills)   No facility-administered encounter medications on file as of 01/26/2022.    Past Medical History:  Diagnosis Date   Hyperlipidemia 2011   Hypertension 2010   Lymphedema    "qone on my mom's side have had problems w/lymphedema; mine started in 2005"    Past Surgical History:  Procedure Laterality Date   NO PAST SURGERIES      Family History  Problem Relation Age of Onset   Heart disease Mother    Hypertension Mother    Alzheimer's disease Mother    Colon polyps Mother    Asthma Sister    Diabetes Maternal Aunt    Cancer Maternal Grandmother    Colon cancer Brother     Social History  Socioeconomic History   Marital status: Single    Spouse name: Not on file   Number of children: Not on file   Years of education: Not on file   Highest education level: Not on file  Occupational History   Not on file  Tobacco Use   Smoking status: Never   Smokeless tobacco: Never  Vaping Use   Vaping Use: Never used  Substance and Sexual Activity   Alcohol use: No   Drug use: No   Sexual activity: Not Currently  Other Topics Concern   Not on file  Social History Narrative   Not on file   Social Determinants of  Health   Financial Resource Strain: Not on file  Food Insecurity: Not on file  Transportation Needs: Not on file  Physical Activity: Not on file  Stress: Not on file  Social Connections: Not on file  Intimate Partner Violence: Not on file    Review of Systems  Constitutional:  Negative for chills, diaphoresis, fever, malaise/fatigue and weight loss.  HENT:  Negative for congestion, hearing loss, nosebleeds, sore throat and tinnitus.   Eyes:  Negative for blurred vision, photophobia and redness.  Respiratory:  Negative for cough, hemoptysis, sputum production, shortness of breath, wheezing and stridor.   Cardiovascular:  Negative for chest pain, palpitations, orthopnea, claudication, leg swelling and PND.  Gastrointestinal:  Negative for abdominal pain, blood in stool, constipation, diarrhea, heartburn, nausea and vomiting.  Genitourinary:  Negative for dysuria, flank pain, frequency, hematuria and urgency.  Musculoskeletal:  Positive for joint pain. Negative for back pain, falls, myalgias and neck pain.  Skin:  Positive for itching and rash.  Neurological:  Negative for dizziness, tingling, tremors, sensory change, speech change, focal weakness, seizures, loss of consciousness, weakness and headaches.  Endo/Heme/Allergies:  Negative for environmental allergies and polydipsia. Does not bruise/bleed easily.  Psychiatric/Behavioral:  Negative for depression, memory loss, substance abuse and suicidal ideas. The patient is not nervous/anxious and does not have insomnia.         Objective    BP 116/72   Pulse (!) 52   Ht 5\' 7"  (1.702 m)   Wt 206 lb 9.6 oz (93.7 kg)   SpO2 98%   BMI 32.36 kg/m   Physical Exam Vitals reviewed.  Constitutional:      Appearance: Normal appearance. He is well-developed. He is not diaphoretic.  HENT:     Head: Normocephalic and atraumatic.     Nose: No nasal deformity, septal deviation, mucosal edema or rhinorrhea.     Right Sinus: No maxillary  sinus tenderness or frontal sinus tenderness.     Left Sinus: No maxillary sinus tenderness or frontal sinus tenderness.     Mouth/Throat:     Pharynx: No oropharyngeal exudate.  Eyes:     General: No scleral icterus.    Conjunctiva/sclera: Conjunctivae normal.     Pupils: Pupils are equal, round, and reactive to light.  Neck:     Thyroid: No thyromegaly.     Vascular: No carotid bruit or JVD.     Trachea: Trachea normal. No tracheal tenderness or tracheal deviation.  Cardiovascular:     Rate and Rhythm: Normal rate and regular rhythm.     Chest Wall: PMI is not displaced.     Pulses: Normal pulses. No decreased pulses.     Heart sounds: Normal heart sounds, S1 normal and S2 normal. Heart sounds not distant. No murmur heard.    No systolic murmur is present.  No diastolic murmur is present.     No friction rub. No gallop. No S3 or S4 sounds.  Pulmonary:     Effort: No tachypnea, accessory muscle usage or respiratory distress.     Breath sounds: No stridor. No decreased breath sounds, wheezing, rhonchi or rales.  Chest:     Chest wall: No tenderness.  Abdominal:     General: Bowel sounds are normal. There is no distension.     Palpations: Abdomen is soft. Abdomen is not rigid.     Tenderness: There is no abdominal tenderness. There is no guarding or rebound.  Musculoskeletal:        General: Swelling and tenderness present.     Cervical back: Normal range of motion and neck supple. No edema, erythema or rigidity. No muscular tenderness. Normal range of motion.  Lymphadenopathy:     Head:     Right side of head: No submental or submandibular adenopathy.     Left side of head: No submental or submandibular adenopathy.     Cervical: No cervical adenopathy.  Skin:    General: Skin is warm and dry.     Coloration: Skin is not pale.     Findings: Rash present.     Nails: There is no clubbing.     Comments: Rash consistent with athlete's foot dorsum left foot  Neurological:      General: No focal deficit present.     Mental Status: He is alert and oriented to person, place, and time.     Sensory: No sensory deficit.  Psychiatric:        Mood and Affect: Mood normal.        Speech: Speech normal.        Behavior: Behavior normal.        Thought Content: Thought content normal.        Judgment: Judgment normal.         Assessment & Plan:   Problem List Items Addressed This Visit       Cardiovascular and Mediastinum   HTN (hypertension) - Primary (Chronic)    Hypertension not controlled no change in medications refills issued patient given a lifestyle medicine handout  The following Lifestyle Medicine recommendations according to American College of Lifestyle Medicine [facility]) were discussed and offered to patient who agrees to start the journey:  A. Whole Foods, Plant-based plate comprising of fruits and vegetables, plant-based proteins, whole-grain carbohydrates was discussed in detail with the patient.   A list for source of those nutrients were also provided to the patient.  Patient will use only water or unsweetened tea for hydration. B.  The need to stay away from risky substances including alcohol, smoking; obtaining 7 to 9 hours of restorative sleep, at least 150 minutes of moderate intensity exercise weekly, the importance of healthy social connections,  and stress reduction techniques were discussed. C.  A full color page of  Calorie density of various food groups per pound showing examples of each food groups was provided to the patient.       Relevant Medications   amLODipine (NORVASC) 10 MG tablet   hydrochlorothiazide (HYDRODIURIL) 25 MG tablet   metoprolol succinate (TOPROL-XL) 50 MG 24 hr tablet   simvastatin (ZOCOR) 20 MG tablet     Musculoskeletal and Integument   Athlete's foot on left    Start Lotrisone cream      Relevant Medications   clotrimazole-betamethasone (LOTRISONE) cream     Other   HLD (hyperlipidemia) (  Chronic)     Patient given lifestyle medicine handout we will maintain statins reassess lipids      Relevant Medications   amLODipine (NORVASC) 10 MG tablet   hydrochlorothiazide (HYDRODIURIL) 25 MG tablet   metoprolol succinate (TOPROL-XL) 50 MG 24 hr tablet   simvastatin (ZOCOR) 20 MG tablet   Lymphedema of left leg    Will continue with stocking support      Relevant Medications   hydrochlorothiazide (HYDRODIURIL) 25 MG tablet   Other Visit Diagnoses     Essential hypertension       Relevant Medications   amLODipine (NORVASC) 10 MG tablet   hydrochlorothiazide (HYDRODIURIL) 25 MG tablet   metoprolol succinate (TOPROL-XL) 50 MG 24 hr tablet   simvastatin (ZOCOR) 20 MG tablet   Dyslipidemia with high LDL and low HDL       Relevant Medications   simvastatin (ZOCOR) 20 MG tablet      Will give tetanus vaccine will have to use patient assistance for same Return in about 5 months (around 06/27/2022) for htn, hyperlipidemia.   Shan Levans, MD

## 2022-01-26 NOTE — Patient Instructions (Signed)
Perform the exercises as noted and take your Voltaren for your knee  Clotrimazole beclomethasone cream given for your athlete's foot apply twice daily  Refills on all medications sent to pharmacy  Follow-up with orthopedics on your nerve conduction study for your hands  Pick up chondroitin sulfate for your knees over-the-counter  Repeat labs at the next visit see Dr. Delford Field 5 months  Follow a healthy diet which will improve your blood pressure and cholesterol  We gave you a lifestyle management handout please review this

## 2022-01-26 NOTE — Assessment & Plan Note (Signed)
Hypertension not controlled no change in medications refills issued patient given a lifestyle medicine handout  The following Lifestyle Medicine recommendations according to American College of Lifestyle Medicine [facility]) were discussed and offered to patient who agrees to start the journey:  A. Whole Foods, Plant-based plate comprising of fruits and vegetables, plant-based proteins, whole-grain carbohydrates was discussed in detail with the patient.   A list for source of those nutrients were also provided to the patient.  Patient will use only water or unsweetened tea for hydration. B.  The need to stay away from risky substances including alcohol, smoking; obtaining 7 to 9 hours of restorative sleep, at least 150 minutes of moderate intensity exercise weekly, the importance of healthy social connections,  and stress reduction techniques were discussed. C.  A full color page of  Calorie density of various food groups per pound showing examples of each food groups was provided to the patient.

## 2022-01-26 NOTE — Assessment & Plan Note (Signed)
Start Lotrisone cream

## 2022-01-26 NOTE — Assessment & Plan Note (Signed)
Will continue with stocking support

## 2022-01-30 ENCOUNTER — Other Ambulatory Visit: Payer: Self-pay

## 2022-02-21 ENCOUNTER — Other Ambulatory Visit: Payer: Self-pay

## 2022-02-21 ENCOUNTER — Other Ambulatory Visit: Payer: Self-pay | Admitting: Critical Care Medicine

## 2022-02-21 DIAGNOSIS — E785 Hyperlipidemia, unspecified: Secondary | ICD-10-CM

## 2022-02-21 DIAGNOSIS — I10 Essential (primary) hypertension: Secondary | ICD-10-CM

## 2022-02-22 ENCOUNTER — Other Ambulatory Visit: Payer: Self-pay

## 2022-02-22 MED ORDER — METOPROLOL SUCCINATE ER 50 MG PO TB24
50.0000 mg | ORAL_TABLET | Freq: Every day | ORAL | 3 refills | Status: DC
  Filled 2022-02-22: qty 30, 30d supply, fill #0
  Filled 2022-02-22: qty 90, 90d supply, fill #0
  Filled 2022-05-23: qty 30, 30d supply, fill #1

## 2022-02-22 MED ORDER — SIMVASTATIN 20 MG PO TABS
20.0000 mg | ORAL_TABLET | Freq: Every evening | ORAL | 3 refills | Status: DC
  Filled 2022-02-22: qty 30, 30d supply, fill #0
  Filled 2022-02-22: qty 90, 90d supply, fill #0
  Filled 2022-05-23: qty 30, 30d supply, fill #1

## 2022-02-23 ENCOUNTER — Other Ambulatory Visit: Payer: Self-pay

## 2022-04-10 ENCOUNTER — Other Ambulatory Visit: Payer: Self-pay

## 2022-04-11 ENCOUNTER — Other Ambulatory Visit: Payer: Self-pay

## 2022-05-23 ENCOUNTER — Other Ambulatory Visit: Payer: Self-pay

## 2022-05-24 ENCOUNTER — Other Ambulatory Visit: Payer: Self-pay

## 2022-05-29 ENCOUNTER — Other Ambulatory Visit: Payer: Self-pay

## 2022-06-07 ENCOUNTER — Other Ambulatory Visit: Payer: Self-pay

## 2022-06-09 ENCOUNTER — Other Ambulatory Visit: Payer: Self-pay

## 2022-06-23 ENCOUNTER — Other Ambulatory Visit: Payer: Self-pay

## 2022-06-23 ENCOUNTER — Other Ambulatory Visit: Payer: Self-pay | Admitting: Critical Care Medicine

## 2022-06-23 ENCOUNTER — Other Ambulatory Visit (HOSPITAL_COMMUNITY): Payer: Self-pay

## 2022-06-23 DIAGNOSIS — I10 Essential (primary) hypertension: Secondary | ICD-10-CM

## 2022-06-23 DIAGNOSIS — E785 Hyperlipidemia, unspecified: Secondary | ICD-10-CM

## 2022-06-23 MED ORDER — METOPROLOL SUCCINATE ER 50 MG PO TB24
50.0000 mg | ORAL_TABLET | Freq: Every day | ORAL | 0 refills | Status: DC
  Filled 2022-06-23: qty 30, 30d supply, fill #0

## 2022-06-23 MED ORDER — SIMVASTATIN 20 MG PO TABS
20.0000 mg | ORAL_TABLET | Freq: Every evening | ORAL | 0 refills | Status: DC
  Filled 2022-06-23: qty 30, 30d supply, fill #0

## 2022-06-26 ENCOUNTER — Other Ambulatory Visit: Payer: Self-pay

## 2022-07-06 ENCOUNTER — Ambulatory Visit: Payer: Self-pay | Attending: Critical Care Medicine | Admitting: Critical Care Medicine

## 2022-07-06 ENCOUNTER — Other Ambulatory Visit: Payer: Self-pay

## 2022-07-06 ENCOUNTER — Encounter: Payer: Self-pay | Admitting: Critical Care Medicine

## 2022-07-06 VITALS — BP 137/75 | HR 53 | Ht 67.0 in | Wt 204.2 lb

## 2022-07-06 DIAGNOSIS — G5603 Carpal tunnel syndrome, bilateral upper limbs: Secondary | ICD-10-CM | POA: Insufficient documentation

## 2022-07-06 DIAGNOSIS — E785 Hyperlipidemia, unspecified: Secondary | ICD-10-CM

## 2022-07-06 DIAGNOSIS — E7800 Pure hypercholesterolemia, unspecified: Secondary | ICD-10-CM

## 2022-07-06 DIAGNOSIS — I890 Lymphedema, not elsewhere classified: Secondary | ICD-10-CM

## 2022-07-06 DIAGNOSIS — I10 Essential (primary) hypertension: Secondary | ICD-10-CM

## 2022-07-06 MED ORDER — DICLOFENAC SODIUM 75 MG PO TBEC
75.0000 mg | DELAYED_RELEASE_TABLET | Freq: Two times a day (BID) | ORAL | 2 refills | Status: DC | PRN
  Filled 2022-07-06: qty 60, 30d supply, fill #0

## 2022-07-06 MED ORDER — METOPROLOL SUCCINATE ER 50 MG PO TB24
50.0000 mg | ORAL_TABLET | Freq: Every day | ORAL | 2 refills | Status: DC
  Filled 2022-07-06 – 2022-07-26 (×2): qty 90, 90d supply, fill #0
  Filled 2022-10-23: qty 90, 90d supply, fill #1

## 2022-07-06 MED ORDER — HYDROCHLOROTHIAZIDE 25 MG PO TABS
25.0000 mg | ORAL_TABLET | Freq: Every day | ORAL | 2 refills | Status: DC | PRN
  Filled 2022-07-06: qty 90, 90d supply, fill #0

## 2022-07-06 MED ORDER — AMLODIPINE BESYLATE 10 MG PO TABS
10.0000 mg | ORAL_TABLET | Freq: Every day | ORAL | 2 refills | Status: DC
  Filled 2022-07-06 – 2022-08-30 (×2): qty 90, 90d supply, fill #0
  Filled 2022-11-26: qty 90, 90d supply, fill #1

## 2022-07-06 MED ORDER — SIMVASTATIN 20 MG PO TABS
20.0000 mg | ORAL_TABLET | Freq: Every evening | ORAL | 2 refills | Status: DC
  Filled 2022-07-06 – 2022-07-26 (×2): qty 90, 90d supply, fill #0
  Filled 2022-10-23: qty 90, 90d supply, fill #1

## 2022-07-06 NOTE — Assessment & Plan Note (Signed)
Assess lipids continue statin

## 2022-07-06 NOTE — Assessment & Plan Note (Signed)
Hypertension at goal continue with amlodipine 10 mg daily hydrochlorothiazide 25 mg daily and metoprolol daily and check metabolic panel

## 2022-07-06 NOTE — Assessment & Plan Note (Signed)
Continue with thigh-high compression hose for work take off at night

## 2022-07-06 NOTE — Progress Notes (Signed)
No concerns. 

## 2022-07-06 NOTE — Patient Instructions (Signed)
Refills on medications sent [facility] today Return Dr Delford Field 6 months

## 2022-07-06 NOTE — Progress Notes (Signed)
New Patient Office Visit  Subjective    Patient ID: Brian Wade, male    DOB: Sep 21, 1965  Age: 57 y.o. MRN: [id number]  CC:  Chief Complaint  Patient presents with   Hypertension    HPI 12/2021 Brian Wade presents to establish care This patient is a former primary care patient of Dr. Jillyn Hidden 57 year old male with chronic lymphedema left lower extremity chronic hypertension chronic knee pain athlete's foot.  Patient arrives with a blood pressure 116/72 well-controlled mild bradycardia pulse rate 52 without symptoms.  He has chronic swelling left lower extremity for years and uses a stocking support and has come in complaining of some athletes feet changes to the left dorsum of the foot Patient was last seen in May of this year with normal labs and does maintain therapy for lipids along with hypertension and was given Voltaren orally for knee pain.  The pain in the knee still persist he would like a refill on the Voltaren  Patient also has carpal tunnel symptoms would like orthopedic evaluation   07/06/22 Patient seen in return follow-up from prior visit in November.  Overall the patient has no complaints.  He is using Voltaren for carpal tunnel syndrome and wrist splints.  He has to stand quite a bit working in [employer].  He has lymphedema left lower extremity is stable.  He had fungal inflammation of the feet resolved with topical therapy Outpatient Encounter Medications as of 07/06/2022  Medication Sig   ibuprofen (ADVIL,MOTRIN) 200 MG tablet Take 600 mg by mouth every 6 (six) hours as needed.   [DISCONTINUED] amLODipine (NORVASC) 10 MG tablet TAKE 1 TABLET BY MOUTH DAILY TO CONTROL BLOOD PRESSURE   [DISCONTINUED] clotrimazole-betamethasone (LOTRISONE) cream Apply 1 Application topically 2 (two) times daily. To affected areas   [DISCONTINUED] diclofenac (VOLTAREN) 75 MG EC tablet Take 1 tablet (75 mg total) by mouth 2 (two) times daily as needed.   [DISCONTINUED] hydrochlorothiazide  (HYDRODIURIL) 25 MG tablet TAKE 1 TABLET (25 MG TOTAL) BY MOUTH DAILY. AS NEEDED FOR LYMPHEDEMA/SWELLING   [DISCONTINUED] metoprolol succinate (TOPROL-XL) 50 MG 24 hr tablet Take 1 tablet (50 mg total) by mouth daily.   [DISCONTINUED] simvastatin (ZOCOR) 20 MG tablet TAKE 1 TABLET BY MOUTH ONCE DAILY AFTER THE EVENING MEAL TO LOWER CHOLESTEROL (Must keep upcoming office visit for refills)   amLODipine (NORVASC) 10 MG tablet TAKE 1 TABLET BY MOUTH DAILY TO CONTROL BLOOD PRESSURE   diclofenac (VOLTAREN) 75 MG EC tablet Take 1 tablet (75 mg total) by mouth 2 (two) times daily as needed.   hydrochlorothiazide (HYDRODIURIL) 25 MG tablet Take 1 tablet (25 mg total) by mouth daily as needed FOR SWELLING   metoprolol succinate (TOPROL-XL) 50 MG 24 hr tablet Take 1 tablet (50 mg total) by mouth daily.   simvastatin (ZOCOR) 20 MG tablet TAKE 1 TABLET BY MOUTH ONCE DAILY AFTER THE EVENING MEAL TO LOWER CHOLESTEROL (Must keep upcoming office visit for refills)   No facility-administered encounter medications on file as of 07/06/2022.    Past Medical History:  Diagnosis Date   Hyperlipidemia 2011   Hypertension 2010   Lymphedema    "qone on my mom's side have had problems w/lymphedema; mine started in 2005"    Past Surgical History:  Procedure Laterality Date   NO PAST SURGERIES      Family History  Problem Relation Age of Onset   Heart disease Mother    Hypertension Mother    Alzheimer's disease Mother  Colon polyps Mother    Asthma Sister    Diabetes Maternal Aunt    Cancer Maternal Grandmother    Colon cancer Brother     Social History   Socioeconomic History   Marital status: Single    Spouse name: Not on file   Number of children: Not on file   Years of education: Not on file   Highest education level: 12th grade  Occupational History   Not on file  Tobacco Use   Smoking status: Never   Smokeless tobacco: Never  Vaping Use   Vaping Use: Never used  Substance and Sexual  Activity   Alcohol use: No   Drug use: No   Sexual activity: Not Currently  Other Topics Concern   Not on file  Social History Narrative   Not on file   Social Determinants of Health   Financial Resource Strain: Low Risk  (07/02/2022)   Overall Financial Resource Strain (CARDIA)    Difficulty of Paying Living Expenses: Not hard at all  Food Insecurity: No Food Insecurity (07/02/2022)   Hunger Vital Sign    Worried About Running Out of Food in the Last Year: Never true    Ran Out of Food in the Last Year: Never true  Transportation Needs: No Transportation Needs (07/02/2022)   PRAPARE - Administrator, Civil Service (Medical): No    Lack of Transportation (Non-Medical): No  Physical Activity: Sufficiently Active (07/02/2022)   Exercise Vital Sign    Days of Exercise per Week: 7 days    Minutes of Exercise per Session: 60 min  Stress: No Stress Concern Present (07/02/2022)   [employer] of Occupational Health - Occupational Stress Questionnaire    Feeling of Stress : Not at all  Social Connections: Moderately Integrated (07/02/2022)   Social Connection and Isolation Panel [NHANES]    Frequency of Communication with Friends and Family: More than three times a week    Frequency of Social Gatherings with Friends and Family: Twice a week    Attends Religious Services: More than 4 times per year    Active Member of [employer] or Organizations: Yes    Attends Engineer, structural: More than 4 times per year    Marital Status: Never married  Intimate Partner Violence: Not on file    Review of Systems  Constitutional:  Negative for chills, diaphoresis, fever, malaise/fatigue and weight loss.  HENT:  Negative for congestion, hearing loss, nosebleeds, sore throat and tinnitus.   Eyes:  Negative for blurred vision, photophobia and redness.  Respiratory:  Negative for cough, hemoptysis, sputum production, shortness of breath, wheezing and stridor.   Cardiovascular:  Negative  for chest pain, palpitations, orthopnea, claudication, leg swelling and PND.  Gastrointestinal:  Negative for abdominal pain, blood in stool, constipation, diarrhea, heartburn, nausea and vomiting.  Genitourinary:  Negative for dysuria, flank pain, frequency, hematuria and urgency.  Musculoskeletal:  Positive for joint pain. Negative for back pain, falls, myalgias and neck pain.  Skin:  Negative for itching and rash.  Neurological:  Negative for dizziness, tingling, tremors, sensory change, speech change, focal weakness, seizures, loss of consciousness, weakness and headaches.  Endo/Heme/Allergies:  Negative for environmental allergies and polydipsia. Does not bruise/bleed easily.  Psychiatric/Behavioral:  Negative for depression, memory loss, substance abuse and suicidal ideas. The patient is not nervous/anxious and does not have insomnia.         Objective    BP 137/75 (BP Location: Left Arm, Patient Position: Sitting,  Cuff Size: Normal)   Pulse (!) 53   Ht 5\' 7"  (1.702 m)   Wt 204 lb 3.2 oz (92.6 kg)   SpO2 99%   BMI 31.98 kg/m   Physical Exam Vitals reviewed.  Constitutional:      Appearance: Normal appearance. He is well-developed. He is not diaphoretic.  HENT:     Head: Normocephalic and atraumatic.     Nose: No nasal deformity, septal deviation, mucosal edema or rhinorrhea.     Right Sinus: No maxillary sinus tenderness or frontal sinus tenderness.     Left Sinus: No maxillary sinus tenderness or frontal sinus tenderness.     Mouth/Throat:     Pharynx: No oropharyngeal exudate.  Eyes:     General: No scleral icterus.    Conjunctiva/sclera: Conjunctivae normal.     Pupils: Pupils are equal, round, and reactive to light.  Neck:     Thyroid: No thyromegaly.     Vascular: No carotid bruit or JVD.     Trachea: Trachea normal. No tracheal tenderness or tracheal deviation.  Cardiovascular:     Rate and Rhythm: Normal rate and regular rhythm.     Chest Wall: PMI is not  displaced.     Pulses: Normal pulses. No decreased pulses.     Heart sounds: Normal heart sounds, S1 normal and S2 normal. Heart sounds not distant. No murmur heard.    No systolic murmur is present.     No diastolic murmur is present.     No friction rub. No gallop. No S3 or S4 sounds.  Pulmonary:     Effort: No tachypnea, accessory muscle usage or respiratory distress.     Breath sounds: No stridor. No decreased breath sounds, wheezing, rhonchi or rales.  Chest:     Chest wall: No tenderness.  Abdominal:     General: Bowel sounds are normal. There is no distension.     Palpations: Abdomen is soft. Abdomen is not rigid.     Tenderness: There is no abdominal tenderness. There is no guarding or rebound.  Musculoskeletal:        General: Tenderness present. No swelling.     Cervical back: Normal range of motion and neck supple. No edema, erythema or rigidity. No muscular tenderness. Normal range of motion.  Lymphadenopathy:     Head:     Right side of head: No submental or submandibular adenopathy.     Left side of head: No submental or submandibular adenopathy.     Cervical: No cervical adenopathy.  Skin:    General: Skin is warm and dry.     Coloration: Skin is not pale.     Findings: No rash.     Nails: There is no clubbing.  Neurological:     General: No focal deficit present.     Mental Status: He is alert and oriented to person, place, and time.     Sensory: No sensory deficit.  Psychiatric:        Mood and Affect: Mood normal.        Speech: Speech normal.        Behavior: Behavior normal.        Thought Content: Thought content normal.        Judgment: Judgment normal.         Assessment & Plan:   Problem List Items Addressed This Visit       Cardiovascular and Mediastinum   HTN (hypertension) - Primary (Chronic)    Hypertension at  goal continue with amlodipine 10 mg daily hydrochlorothiazide 25 mg daily and metoprolol daily and check metabolic panel       Relevant Medications   amLODipine (NORVASC) 10 MG tablet   hydrochlorothiazide (HYDRODIURIL) 25 MG tablet   metoprolol succinate (TOPROL-XL) 50 MG 24 hr tablet   simvastatin (ZOCOR) 20 MG tablet     Nervous and Auditory   Bilateral carpal tunnel syndrome    Continue with wrist splints bilaterally at night and topical Voltaren gel during the day        Other   HLD (hyperlipidemia) (Chronic)    Assess lipids continue statin      Relevant Medications   amLODipine (NORVASC) 10 MG tablet   hydrochlorothiazide (HYDRODIURIL) 25 MG tablet   metoprolol succinate (TOPROL-XL) 50 MG 24 hr tablet   simvastatin (ZOCOR) 20 MG tablet   Lymphedema of left leg    Continue with thigh-high compression hose for work take off at night      Relevant Medications   hydrochlorothiazide (HYDRODIURIL) 25 MG tablet   Other Visit Diagnoses     Essential hypertension       Relevant Medications   amLODipine (NORVASC) 10 MG tablet   hydrochlorothiazide (HYDRODIURIL) 25 MG tablet   metoprolol succinate (TOPROL-XL) 50 MG 24 hr tablet   simvastatin (ZOCOR) 20 MG tablet   Other Relevant Orders   Basic metabolic panel   Dyslipidemia with high LDL and low HDL       Relevant Medications   simvastatin (ZOCOR) 20 MG tablet   Other Relevant Orders   Lipid panel     Will give tetanus vaccine will have to use patient assistance for same Return in about 6 months (around 01/06/2023) for htn.   Shan Levans, MD

## 2022-07-06 NOTE — Assessment & Plan Note (Signed)
Continue with wrist splints bilaterally at night and topical Voltaren gel during the day

## 2022-07-07 ENCOUNTER — Other Ambulatory Visit: Payer: Self-pay

## 2022-07-07 LAB — BASIC METABOLIC PANEL
BUN/Creatinine Ratio: 12 (ref 9–20)
BUN: 15 mg/dL (ref 6–24)
CO2: 21 mmol/L (ref 20–29)
Calcium: 9.3 mg/dL (ref 8.7–10.2)
Chloride: 104 mmol/L (ref 96–106)
Creatinine, Ser: 1.28 mg/dL — ABNORMAL HIGH (ref 0.76–1.27)
Glucose: 94 mg/dL (ref 70–99)
Potassium: 4.2 mmol/L (ref 3.5–5.2)
Sodium: 141 mmol/L (ref 134–144)
eGFR: 65 mL/min/{1.73_m2} (ref >59)

## 2022-07-07 LAB — LIPID PANEL
Chol/HDL Ratio: 4.1 ratio (ref 0.0–5.0)
Cholesterol, Total: 138 mg/dL (ref 100–199)
HDL: 34 mg/dL — ABNORMAL LOW (ref >39)
LDL Chol Calc (NIH): 84 mg/dL (ref 0–99)
Triglycerides: 111 mg/dL (ref 0–149)
VLDL Cholesterol Cal: 20 mg/dL (ref 5–40)

## 2022-07-07 NOTE — Progress Notes (Signed)
Let pt know his kidney function is slightly weak. He needs to STOP ibuprofen and oral Diclofenac. He can use tylenol two 500mg  tabs 4 times a day as needed for pain and can use topical Diclofenac cream

## 2022-07-26 ENCOUNTER — Other Ambulatory Visit: Payer: Self-pay

## 2022-07-28 ENCOUNTER — Other Ambulatory Visit: Payer: Self-pay

## 2022-08-30 ENCOUNTER — Other Ambulatory Visit: Payer: Self-pay

## 2022-09-01 ENCOUNTER — Other Ambulatory Visit: Payer: Self-pay

## 2022-10-23 ENCOUNTER — Other Ambulatory Visit: Payer: Self-pay

## 2022-10-24 ENCOUNTER — Other Ambulatory Visit: Payer: Self-pay

## 2022-11-27 ENCOUNTER — Other Ambulatory Visit: Payer: Self-pay

## 2023-01-11 ENCOUNTER — Other Ambulatory Visit: Payer: Self-pay

## 2023-01-11 ENCOUNTER — Encounter: Payer: Self-pay | Admitting: Physician Assistant

## 2023-01-11 ENCOUNTER — Ambulatory Visit: Payer: Self-pay | Attending: Critical Care Medicine | Admitting: Physician Assistant

## 2023-01-11 ENCOUNTER — Ambulatory Visit: Payer: Self-pay | Admitting: Critical Care Medicine

## 2023-01-11 DIAGNOSIS — E785 Hyperlipidemia, unspecified: Secondary | ICD-10-CM

## 2023-01-11 DIAGNOSIS — I10 Essential (primary) hypertension: Secondary | ICD-10-CM

## 2023-01-11 DIAGNOSIS — I890 Lymphedema, not elsewhere classified: Secondary | ICD-10-CM

## 2023-01-11 MED ORDER — METOPROLOL SUCCINATE ER 50 MG PO TB24
50.0000 mg | ORAL_TABLET | Freq: Every day | ORAL | 2 refills | Status: DC
  Filled 2023-01-11 – 2023-01-18 (×2): qty 90, 90d supply, fill #0
  Filled 2023-04-16: qty 90, 90d supply, fill #1
  Filled 2023-07-15 – 2023-07-16 (×2): qty 90, 90d supply, fill #2

## 2023-01-11 MED ORDER — SIMVASTATIN 20 MG PO TABS
20.0000 mg | ORAL_TABLET | Freq: Every evening | ORAL | 2 refills | Status: DC
  Filled 2023-01-11 – 2023-01-18 (×2): qty 90, 90d supply, fill #0
  Filled 2023-04-16: qty 90, 90d supply, fill #1
  Filled 2023-07-15 – 2023-07-16 (×2): qty 90, 90d supply, fill #2

## 2023-01-11 MED ORDER — HYDROCHLOROTHIAZIDE 25 MG PO TABS
25.0000 mg | ORAL_TABLET | Freq: Every day | ORAL | 2 refills | Status: DC | PRN
  Filled 2023-01-11 – 2023-05-21 (×2): qty 90, 90d supply, fill #0

## 2023-01-11 MED ORDER — AMLODIPINE BESYLATE 10 MG PO TABS
10.0000 mg | ORAL_TABLET | Freq: Every day | ORAL | 2 refills | Status: DC
  Filled 2023-01-11 – 2023-02-22 (×2): qty 90, 90d supply, fill #0
  Filled 2023-05-20 – 2023-05-21 (×2): qty 90, 90d supply, fill #1

## 2023-01-11 NOTE — Progress Notes (Signed)
Patient ID: Brian Wade, male   DOB: [birth year], 57 y.o.   MRN: [id number]   Brian Wade, is a 57 y.o. male  MVH:[id number]  [id number]:[id number]  DOB - 1965/06/08  Chief Complaint  Patient presents with   Medical Management of Chronic Issues       Subjective:   Brian Wade is a 57 y.o. male here today for med RF and 6 month check up.  He has not been taking NSAIDS bc kidney function was slightly impaired.  Tylenol has been helping his L knee pain.  He is compliant with meds.  He does not drink water.  He does not want flu shot bc he is afraid he will have side effects.    No problems updated.  ALLERGIES: Allergies  Allergen Reactions   Penicillins Hives and Swelling    Has patient had a PCN reaction causing immediate rash, facial/tongue/throat swelling, SOB or lightheadedness with hypotension: YES Has patient had a PCN reaction causing severe rash involving mucus membranes or skin necrosis: No Has patient had a PCN reaction that required hospitalization: Yes Has patient had a PCN reaction occurring within the last 10 years: No If all of the above answers are "NO", then may proceed with Cephalosporin use.    PAST MEDICAL HISTORY: Past Medical History:  Diagnosis Date   Hyperlipidemia 2011   Hypertension 2010   Lymphedema    "qone on my mom's side have had problems w/lymphedema; mine started in 2005"    MEDICATIONS AT HOME: Prior to Admission medications   Medication Sig Start Date End Date Taking? Authorizing Provider  amLODipine (NORVASC) 10 MG tablet TAKE 1 TABLET BY MOUTH DAILY TO CONTROL BLOOD PRESSURE 01/11/23 04/11/23  Anders Simmonds, PA-C  hydrochlorothiazide (HYDRODIURIL) 25 MG tablet Take 1 tablet (25 mg total) by mouth daily as needed FOR SWELLING 01/11/23   Anders Simmonds, PA-C  ibuprofen (ADVIL,MOTRIN) 200 MG tablet Take 600 mg by mouth every 6 (six) hours as needed.    [provider]  metoprolol succinate (TOPROL-XL) 50 MG 24 hr tablet Take 1 tablet  (50 mg total) by mouth daily. 01/11/23   Anders Simmonds, PA-C  simvastatin (ZOCOR) 20 MG tablet Take 1 tablet (20 mg total) by mouth every evening. 01/11/23   Ellaree Gear, Marzella Schlein, PA-C    ROS: Neg HEENT Neg resp Neg cardiac Neg GI Neg GU Neg MS Neg psych Neg neuro  Objective:   Vitals:   01/11/23 1002  BP: 133/73  Pulse: 66  SpO2: 98%  Weight: 212 lb 12.8 oz (96.5 kg)  Height: 5\' 7"  (1.702 m)   Exam General appearance : Awake, alert, not in any distress. Speech Clear. Not toxic looking HEENT: Atraumatic and Normocephalic Neck: Supple, no JVD. No cervical lymphadenopathy.  Chest: Good air entry bilaterally, CTAB.  No rales/rhonchi/wheezing CVS: S1 S2 regular, no murmurs.  Extremities: B/L Lower Ext shows no edema, both legs are warm to touch Neurology: Awake alert, and oriented X 3, CN II-XII intact, Non focal Skin: No Rash  Data Review Lab Results  Component Value Date   HGBA1C 5.5 06/07/2020   HGBA1C 5.5 03/05/2019   HGBA1C 5.0 09/28/2016    Assessment & Plan   1. Dyslipidemia with high LDL and low HDL - simvastatin (ZOCOR) 20 MG tablet; Take 1 tablet (20 mg total) by mouth every evening.  Dispense: 90 tablet; Refill: 2 - Comprehensive metabolic panel  2. Essential hypertension controlled - metoprolol succinate (TOPROL-XL) 50 MG 24 hr tablet; Take  1 tablet (50 mg total) by mouth daily.  Dispense: 90 tablet; Refill: 2 - amLODipine (NORVASC) 10 MG tablet; TAKE 1 TABLET BY MOUTH DAILY TO CONTROL BLOOD PRESSURE  Dispense: 90 tablet; Refill: 2 - hydrochlorothiazide (HYDRODIURIL) 25 MG tablet; Take 1 tablet (25 mg total) by mouth daily as needed FOR SWELLING  Dispense: 90 tablet; Refill: 2 - Comprehensive metabolic panel  3. Lymphedema of left leg stable - hydrochlorothiazide (HYDRODIURIL) 25 MG tablet; Take 1 tablet (25 mg total) by mouth daily as needed FOR SWELLING  Dispense: 90 tablet; Refill: 2 - Comprehensive metabolic panel    Return in about 6 months  (around 07/11/2023) for PCP for chronic conditions-please assign to new PCP.  The patient was given clear instructions to go to ER or return to medical center if symptoms don't improve, worsen or new problems develop. The patient verbalized understanding. The patient was told to call to get lab results if they haven't heard anything in the next week.      Georgian Co, PA-C [facility] and [facility] [place], [place] [phone]   01/11/2023, 10:25 AM

## 2023-01-12 LAB — COMPREHENSIVE METABOLIC PANEL
ALT: 7 [IU]/L (ref 0–44)
AST: 15 [IU]/L (ref 0–40)
Albumin: 4.4 g/dL (ref 3.8–4.9)
Alkaline Phosphatase: 77 [IU]/L (ref 44–121)
BUN/Creatinine Ratio: 10 (ref 9–20)
BUN: 11 mg/dL (ref 6–24)
Bilirubin Total: 0.5 mg/dL (ref 0.0–1.2)
CO2: 21 mmol/L (ref 20–29)
Calcium: 9.3 mg/dL (ref 8.7–10.2)
Chloride: 103 mmol/L (ref 96–106)
Creatinine, Ser: 1.15 mg/dL (ref 0.76–1.27)
Globulin, Total: 3.1 g/dL (ref 1.5–4.5)
Glucose: 96 mg/dL (ref 70–99)
Potassium: 4.3 mmol/L (ref 3.5–5.2)
Sodium: 140 mmol/L (ref 134–144)
Total Protein: 7.5 g/dL (ref 6.0–8.5)
eGFR: 73 mL/min/{1.73_m2} (ref >59)

## 2023-01-15 ENCOUNTER — Telehealth: Payer: Self-pay

## 2023-01-15 NOTE — Telephone Encounter (Signed)
Patient viewed results and Doctor comment through [facility]

## 2023-01-15 NOTE — Telephone Encounter (Signed)
-----   Message from Georgian Co sent at 01/15/2023  7:47 AM EST ----- Blood work is normal including kidneys, liver, and electrolytes.  Thanks, Georgian Co, PA-C

## 2023-01-18 ENCOUNTER — Other Ambulatory Visit: Payer: Self-pay

## 2023-01-22 ENCOUNTER — Other Ambulatory Visit: Payer: Self-pay

## 2023-02-22 ENCOUNTER — Other Ambulatory Visit: Payer: Self-pay

## 2023-02-23 ENCOUNTER — Other Ambulatory Visit: Payer: Self-pay

## 2023-02-26 ENCOUNTER — Other Ambulatory Visit: Payer: Self-pay

## 2023-04-16 ENCOUNTER — Other Ambulatory Visit: Payer: Self-pay

## 2023-04-17 ENCOUNTER — Other Ambulatory Visit: Payer: Self-pay

## 2023-05-21 ENCOUNTER — Other Ambulatory Visit: Payer: Self-pay

## 2023-05-22 ENCOUNTER — Other Ambulatory Visit: Payer: Self-pay

## 2023-06-29 ENCOUNTER — Telehealth: Payer: Self-pay

## 2023-06-29 NOTE — Telephone Encounter (Signed)
Please send to correct person

## 2023-06-29 NOTE — Telephone Encounter (Signed)
 Copied from CRM [id number]. Topic: General - Other >> Jun 28, 2023  4:35 PM Sophia H wrote: Reason for CRM: pt was calling in to reschedule his appt that was scheduled for 5/14 with Dulce Gibbs. Nothing available till Sept, he decl scheduling because he didn't want to have issues with his medication. Pt req that the clinic reach out to him directly please, ty. [phone]

## 2023-07-11 ENCOUNTER — Ambulatory Visit: Payer: Self-pay | Admitting: Physician Assistant

## 2023-07-16 ENCOUNTER — Other Ambulatory Visit: Payer: Self-pay

## 2023-08-01 NOTE — Progress Notes (Unsigned)
 est Patient Office Visit  Subjective    Patient ID: Brian Wade, male    DOB: 1965/03/15  Age: 58 y.o. MRN: [id number]  CC:  No chief complaint on file.   HPI 12/2021 Brian Wade presents to establish care This patient is a former primary care patient of Dr. Leann Prom 58 year old male with chronic lymphedema left lower extremity chronic hypertension chronic knee pain athlete's foot.  Patient arrives with a blood pressure 116/72 well-controlled mild bradycardia pulse rate 52 without symptoms.  He has chronic swelling left lower extremity for years and uses a stocking support and has come in complaining of some athletes feet changes to the left dorsum of the foot Patient was last seen in May of this year with normal labs and does maintain therapy for lipids along with hypertension and was given Voltaren  orally for knee pain.  The pain in the knee still persist he would like a refill on the Voltaren   Patient also has carpal tunnel symptoms would like orthopedic evaluation   07/06/22 Patient seen in return follow-up from prior visit in November.  Overall the patient has no complaints.  He is using Voltaren  for carpal tunnel syndrome and wrist splints.  He has to stand quite a bit working in [employer].  He has lymphedema left lower extremity is stable.  He had fungal inflammation of the feet resolved with topical therapy  08/01/23  Outpatient Encounter Medications as of 08/02/2023  Medication Sig   amLODipine  (NORVASC ) 10 MG tablet TAKE 1 TABLET BY MOUTH DAILY TO CONTROL BLOOD PRESSURE   hydrochlorothiazide  (HYDRODIURIL ) 25 MG tablet Take 1 tablet (25 mg total) by mouth daily as needed FOR SWELLING   ibuprofen  (ADVIL ,MOTRIN ) 200 MG tablet Take 600 mg by mouth every 6 (six) hours as needed.   metoprolol  succinate (TOPROL -XL) 50 MG 24 hr tablet Take 1 tablet (50 mg total) by mouth daily.   simvastatin  (ZOCOR ) 20 MG tablet Take 1 tablet (20 mg total) by mouth every evening.   No  facility-administered encounter medications on file as of 08/02/2023.    Past Medical History:  Diagnosis Date   Hyperlipidemia 2011   Hypertension 2010   Lymphedema    "qone on my mom's side have had problems w/lymphedema; mine started in 2005"    Past Surgical History:  Procedure Laterality Date   NO PAST SURGERIES      Family History  Problem Relation Age of Onset   Heart disease Mother    Hypertension Mother    Alzheimer's disease Mother    Colon polyps Mother    Asthma Sister    Diabetes Maternal Aunt    Cancer Maternal Grandmother    Colon cancer Brother     Social History   Socioeconomic History   Marital status: Single    Spouse name: Not on file   Number of children: Not on file   Years of education: Not on file   Highest education level: 12th grade  Occupational History   Not on file  Tobacco Use   Smoking status: Never   Smokeless tobacco: Never  Vaping Use   Vaping status: Never Used  Substance and Sexual Activity   Alcohol use: No   Drug use: No   Sexual activity: Not Currently  Other Topics Concern   Not on file  Social History Narrative   Not on file   Social Drivers of Health   Financial Resource Strain: Low Risk  (07/02/2022)   Overall Financial Resource Strain (CARDIA)  Difficulty of Paying Living Expenses: Not hard at all  Food Insecurity: No Food Insecurity (07/02/2022)   Hunger Vital Sign    Worried About Running Out of Food in the Last Year: Never true    Ran Out of Food in the Last Year: Never true  Transportation Needs: No Transportation Needs (07/02/2022)   PRAPARE - Administrator, Civil Service (Medical): No    Lack of Transportation (Non-Medical): No  Physical Activity: Sufficiently Active (07/02/2022)   Exercise Vital Sign    Days of Exercise per Week: 7 days    Minutes of Exercise per Session: 60 min  Stress: No Stress Concern Present (07/02/2022)   [employer] of Occupational Health - Occupational Stress  Questionnaire    Feeling of Stress : Not at all  Social Connections: Moderately Integrated (07/02/2022)   Social Connection and Isolation Panel [NHANES]    Frequency of Communication with Friends and Family: More than three times a week    Frequency of Social Gatherings with Friends and Family: Twice a week    Attends Religious Services: More than 4 times per year    Active Member of [employer] or Organizations: Yes    Attends Engineer, structural: More than 4 times per year    Marital Status: Never married  Intimate Partner Violence: Not on file    Review of Systems  Constitutional:  Negative for chills, diaphoresis, fever, malaise/fatigue and weight loss.  HENT:  Negative for congestion, hearing loss, nosebleeds, sore throat and tinnitus.   Eyes:  Negative for blurred vision, photophobia and redness.  Respiratory:  Negative for cough, hemoptysis, sputum production, shortness of breath, wheezing and stridor.   Cardiovascular:  Negative for chest pain, palpitations, orthopnea, claudication, leg swelling and PND.  Gastrointestinal:  Negative for abdominal pain, blood in stool, constipation, diarrhea, heartburn, nausea and vomiting.  Genitourinary:  Negative for dysuria, flank pain, frequency, hematuria and urgency.  Musculoskeletal:  Positive for joint pain. Negative for back pain, falls, myalgias and neck pain.  Skin:  Negative for itching and rash.  Neurological:  Negative for dizziness, tingling, tremors, sensory change, speech change, focal weakness, seizures, loss of consciousness, weakness and headaches.  Endo/Heme/Allergies:  Negative for environmental allergies and polydipsia. Does not bruise/bleed easily.  Psychiatric/Behavioral:  Negative for depression, memory loss, substance abuse and suicidal ideas. The patient is not nervous/anxious and does not have insomnia.         Objective    There were no vitals taken for this visit.  Physical Exam Vitals reviewed.   Constitutional:      Appearance: Normal appearance. He is well-developed. He is not diaphoretic.  HENT:     Head: Normocephalic and atraumatic.     Nose: No nasal deformity, septal deviation, mucosal edema or rhinorrhea.     Right Sinus: No maxillary sinus tenderness or frontal sinus tenderness.     Left Sinus: No maxillary sinus tenderness or frontal sinus tenderness.     Mouth/Throat:     Pharynx: No oropharyngeal exudate.  Eyes:     General: No scleral icterus.    Conjunctiva/sclera: Conjunctivae normal.     Pupils: Pupils are equal, round, and reactive to light.  Neck:     Thyroid: No thyromegaly.     Vascular: No carotid bruit or JVD.     Trachea: Trachea normal. No tracheal tenderness or tracheal deviation.  Cardiovascular:     Rate and Rhythm: Normal rate and regular rhythm.     Chest  Wall: PMI is not displaced.     Pulses: Normal pulses. No decreased pulses.     Heart sounds: Normal heart sounds, S1 normal and S2 normal. Heart sounds not distant. No murmur heard.    No systolic murmur is present.     No diastolic murmur is present.     No friction rub. No gallop. No S3 or S4 sounds.  Pulmonary:     Effort: No tachypnea, accessory muscle usage or respiratory distress.     Breath sounds: No stridor. No decreased breath sounds, wheezing, rhonchi or rales.  Chest:     Chest wall: No tenderness.  Abdominal:     General: Bowel sounds are normal. There is no distension.     Palpations: Abdomen is soft. Abdomen is not rigid.     Tenderness: There is no abdominal tenderness. There is no guarding or rebound.  Musculoskeletal:        General: Tenderness present. No swelling.     Cervical back: Normal range of motion and neck supple. No edema, erythema or rigidity. No muscular tenderness. Normal range of motion.  Lymphadenopathy:     Head:     Right side of head: No submental or submandibular adenopathy.     Left side of head: No submental or submandibular adenopathy.      Cervical: No cervical adenopathy.  Skin:    General: Skin is warm and dry.     Coloration: Skin is not pale.     Findings: No rash.     Nails: There is no clubbing.  Neurological:     General: No focal deficit present.     Mental Status: He is alert and oriented to person, place, and time.     Sensory: No sensory deficit.  Psychiatric:        Mood and Affect: Mood normal.        Speech: Speech normal.        Behavior: Behavior normal.        Thought Content: Thought content normal.        Judgment: Judgment normal.         Assessment & Plan:   Problem List Items Addressed This Visit   None  Will give tetanus vaccine will have to use patient assistance for same No follow-ups on file.   Arlene Lacy, MD

## 2023-08-02 ENCOUNTER — Other Ambulatory Visit: Payer: Self-pay

## 2023-08-02 ENCOUNTER — Ambulatory Visit: Payer: Self-pay | Attending: Critical Care Medicine | Admitting: Critical Care Medicine

## 2023-08-02 ENCOUNTER — Encounter: Payer: Self-pay | Admitting: Critical Care Medicine

## 2023-08-02 VITALS — BP 112/66 | HR 65 | Temp 97.9°F | Wt 204.0 lb

## 2023-08-02 DIAGNOSIS — I10 Essential (primary) hypertension: Secondary | ICD-10-CM

## 2023-08-02 DIAGNOSIS — E785 Hyperlipidemia, unspecified: Secondary | ICD-10-CM

## 2023-08-02 DIAGNOSIS — E7800 Pure hypercholesterolemia, unspecified: Secondary | ICD-10-CM

## 2023-08-02 DIAGNOSIS — I890 Lymphedema, not elsewhere classified: Secondary | ICD-10-CM

## 2023-08-02 MED ORDER — AMLODIPINE BESYLATE 10 MG PO TABS
10.0000 mg | ORAL_TABLET | Freq: Every day | ORAL | 2 refills | Status: DC
  Filled 2023-08-02 – 2023-08-22 (×2): qty 90, 90d supply, fill #0
  Filled 2023-11-17 – 2023-11-19 (×2): qty 90, 90d supply, fill #1
  Filled 2024-01-30: qty 90, 90d supply, fill #2

## 2023-08-02 MED ORDER — SIMVASTATIN 20 MG PO TABS
20.0000 mg | ORAL_TABLET | Freq: Every evening | ORAL | 2 refills | Status: DC
  Filled 2023-08-02 – 2023-10-10 (×3): qty 90, 90d supply, fill #0
  Filled 2024-01-08 – 2024-01-09 (×2): qty 90, 90d supply, fill #1

## 2023-08-02 MED ORDER — HYDROCHLOROTHIAZIDE 25 MG PO TABS
25.0000 mg | ORAL_TABLET | Freq: Every day | ORAL | 2 refills | Status: AC | PRN
  Filled 2023-08-02 – 2023-11-19 (×2): qty 90, 90d supply, fill #0

## 2023-08-02 MED ORDER — METOPROLOL SUCCINATE ER 50 MG PO TB24
50.0000 mg | ORAL_TABLET | Freq: Every day | ORAL | 2 refills | Status: DC
  Filled 2023-08-02 – 2023-10-10 (×3): qty 90, 90d supply, fill #0
  Filled 2024-01-08 – 2024-01-09 (×2): qty 90, 90d supply, fill #1

## 2023-08-02 NOTE — Patient Instructions (Signed)
 All medications refilled sent to your pharmacy  Once you get Medicaid please get a dental appointment for your teeth  Call us  when you get Medicaid we can give you the shingles vaccine  Return in 6 months for new primary care  Continue your other healthy lifestyles

## 2023-08-02 NOTE — Assessment & Plan Note (Signed)
Blood pressure well controlled continue current medications 

## 2023-08-02 NOTE — Assessment & Plan Note (Signed)
Refill lipid therapy 

## 2023-08-03 ENCOUNTER — Other Ambulatory Visit: Payer: Self-pay

## 2023-08-06 ENCOUNTER — Other Ambulatory Visit: Payer: Self-pay

## 2023-08-22 ENCOUNTER — Other Ambulatory Visit: Payer: Self-pay

## 2023-08-24 ENCOUNTER — Other Ambulatory Visit: Payer: Self-pay

## 2023-10-11 ENCOUNTER — Other Ambulatory Visit: Payer: Self-pay

## 2023-10-12 ENCOUNTER — Other Ambulatory Visit: Payer: Self-pay

## 2023-11-19 ENCOUNTER — Other Ambulatory Visit: Payer: Self-pay

## 2024-01-09 ENCOUNTER — Other Ambulatory Visit: Payer: Self-pay

## 2024-01-11 ENCOUNTER — Other Ambulatory Visit: Payer: Self-pay

## 2024-01-30 ENCOUNTER — Telehealth: Payer: Self-pay | Admitting: Critical Care Medicine

## 2024-01-30 ENCOUNTER — Other Ambulatory Visit: Payer: Self-pay

## 2024-01-30 NOTE — Telephone Encounter (Signed)
 Pt unconfirmed appt 12/3 (per vr ) lvm

## 2024-02-01 ENCOUNTER — Other Ambulatory Visit: Payer: Self-pay

## 2024-02-01 ENCOUNTER — Encounter: Payer: Self-pay | Admitting: Nurse Practitioner

## 2024-02-01 ENCOUNTER — Ambulatory Visit: Payer: Self-pay | Attending: Nurse Practitioner | Admitting: Nurse Practitioner

## 2024-02-01 VITALS — BP 118/78 | HR 81 | Resp 19 | Ht 67.0 in | Wt 208.0 lb

## 2024-02-01 DIAGNOSIS — R7309 Other abnormal glucose: Secondary | ICD-10-CM

## 2024-02-01 DIAGNOSIS — R350 Frequency of micturition: Secondary | ICD-10-CM

## 2024-02-01 DIAGNOSIS — Z7182 Exercise counseling: Secondary | ICD-10-CM

## 2024-02-01 DIAGNOSIS — I10 Essential (primary) hypertension: Secondary | ICD-10-CM

## 2024-02-01 DIAGNOSIS — Z713 Dietary counseling and surveillance: Secondary | ICD-10-CM

## 2024-02-01 DIAGNOSIS — E782 Mixed hyperlipidemia: Secondary | ICD-10-CM

## 2024-02-01 MED ORDER — METOPROLOL SUCCINATE ER 50 MG PO TB24
50.0000 mg | ORAL_TABLET | Freq: Every day | ORAL | 2 refills | Status: DC
  Filled 2024-02-01: qty 90, 90d supply, fill #0

## 2024-02-01 MED ORDER — SIMVASTATIN 20 MG PO TABS
20.0000 mg | ORAL_TABLET | Freq: Every evening | ORAL | 2 refills | Status: AC
  Filled 2024-02-01: qty 90, 90d supply, fill #0

## 2024-02-01 MED ORDER — METOPROLOL SUCCINATE ER 50 MG PO TB24
50.0000 mg | ORAL_TABLET | Freq: Every day | ORAL | 2 refills | Status: AC
  Filled 2024-02-01: qty 90, 90d supply, fill #0

## 2024-02-01 MED ORDER — SIMVASTATIN 20 MG PO TABS
20.0000 mg | ORAL_TABLET | Freq: Every evening | ORAL | 2 refills | Status: DC
  Filled 2024-02-01: qty 90, 90d supply, fill #0

## 2024-02-01 MED ORDER — AMLODIPINE BESYLATE 10 MG PO TABS: 10.0000 mg | ORAL_TABLET | Freq: Every day | ORAL | 2 refills | Status: AC

## 2024-02-01 NOTE — Progress Notes (Signed)
 Assessment & Plan:  Brian Wade was seen today for hypertension.  Diagnoses and all orders for this visit:  Primary hypertension -     amLODipine  (NORVASC ) 10 MG tablet; TAKE 1 TABLET BY MOUTH DAILY TO CONTROL BLOOD PRESSURE -     CMP14+EGFR -     CBC with Differential/Platelet -     metoprolol  succinate (TOPROL -XL) 50 MG 24 hr tablet; Take 1 tablet (50 mg total) by mouth daily. For blood pressure  Mixed hyperlipidemia -     Lipid panel -     simvastatin  (ZOCOR ) 20 MG tablet; Take 1 tablet (20 mg total) by mouth every evening. For cholesterol  Increased urinary frequency -     PSA  Elevated glucose -     Hemoglobin A1c    Patient has been counseled on age-appropriate routine health concerns for screening and prevention. These are reviewed and up-to-date. Referrals have been placed accordingly. Immunizations are up-to-date or declined.    Subjective:   Chief Complaint  Patient presents with   Hypertension    Brian Wade 58 y.o. male presents to office today for HTN  He is a previous patient of Dr. Brien but will be establishing care with me today.  He has a history of hyperlipidemia, hypertension and bilateral peripheral lymphedema.  HTN Blood pressure is well controlled. He is taking amlodipine  10mg  daily, toprol  XL 50 mg daily. He has hydrochlorothiazide  as needed for fluid retention.  BP Readings from Last 3 Encounters:  02/01/24 118/78  08/02/23 112/66  01/11/23 133/73     Overall he has no questions or concerns today.    Review of Systems  Constitutional:  Negative for fever, malaise/fatigue and weight loss.  HENT: Negative.  Negative for nosebleeds.   Eyes: Negative.  Negative for blurred vision, double vision and photophobia.  Respiratory: Negative.  Negative for cough and shortness of breath.   Cardiovascular: Negative.  Negative for chest pain, palpitations and leg swelling.  Gastrointestinal: Negative.  Negative for heartburn, nausea and vomiting.   Musculoskeletal: Negative.  Negative for myalgias.  Neurological: Negative.  Negative for dizziness, focal weakness, seizures and headaches.  Psychiatric/Behavioral: Negative.  Negative for suicidal ideas.     Past Medical History:  Diagnosis Date   Hyperlipidemia 2011   Hypertension 2010   Lymphedema    qone on my mom's side have had problems w/lymphedema; mine started in 2005    Past Surgical History:  Procedure Laterality Date   NO PAST SURGERIES      Family History  Problem Relation Age of Onset   Heart disease Mother    Hypertension Mother    Alzheimer's disease Mother    Colon polyps Mother    Asthma Sister    Diabetes Maternal Aunt    Cancer Maternal Grandmother    Colon cancer Brother     Social History Reviewed with no changes to be made today.   Outpatient Medications Prior to Visit  Medication Sig Dispense Refill   hydrochlorothiazide  (HYDRODIURIL ) 25 MG tablet Take 1 tablet (25 mg total) by mouth daily as needed FOR SWELLING 90 tablet 2   ibuprofen  (ADVIL ,MOTRIN ) 200 MG tablet Take 600 mg by mouth every 6 (six) hours as needed.     amLODipine  (NORVASC ) 10 MG tablet TAKE 1 TABLET BY MOUTH DAILY TO CONTROL BLOOD PRESSURE 90 tablet 2   metoprolol  succinate (TOPROL -XL) 50 MG 24 hr tablet Take 1 tablet (50 mg total) by mouth daily. 90 tablet 2   simvastatin  (ZOCOR ) 20  MG tablet Take 1 tablet (20 mg total) by mouth every evening. 90 tablet 2   No facility-administered medications prior to visit.    Allergies  Allergen Reactions   Penicillins Hives and Swelling    Has patient had a PCN reaction causing immediate rash, facial/tongue/throat swelling, SOB or lightheadedness with hypotension: YES Has patient had a PCN reaction causing severe rash involving mucus membranes or skin necrosis: No Has patient had a PCN reaction that required hospitalization: Yes Has patient had a PCN reaction occurring within the last 10 years: No If all of the above answers are  NO, then may proceed with Cephalosporin use.       Objective:    BP 118/78 (BP Location: Left Arm, Patient Position: Sitting, Cuff Size: Normal)   Pulse 81   Resp 19   Ht 5' 7 (1.702 m)   Wt 208 lb (94.3 kg)   SpO2 100%   BMI 32.58 kg/m  Wt Readings from Last 3 Encounters:  02/01/24 208 lb (94.3 kg)  08/02/23 204 lb (92.5 kg)  01/11/23 212 lb 12.8 oz (96.5 kg)    Physical Exam Vitals and nursing note reviewed.  Constitutional:      Appearance: He is well-developed.  HENT:     Head: Normocephalic and atraumatic.  Cardiovascular:     Rate and Rhythm: Normal rate and regular rhythm.     Heart sounds: Normal heart sounds. No murmur heard.    No friction rub. No gallop.  Pulmonary:     Effort: Pulmonary effort is normal. No tachypnea or respiratory distress.     Breath sounds: Normal breath sounds. No decreased breath sounds, wheezing, rhonchi or rales.  Chest:     Chest wall: No tenderness.  Abdominal:     General: Bowel sounds are normal.     Palpations: Abdomen is soft.  Musculoskeletal:        General: Normal range of motion.     Cervical back: Normal range of motion.     Right lower leg: Edema present.     Left lower leg: Edema present.     Comments: Chronic lymphedema. Stable  Skin:    General: Skin is warm and dry.  Neurological:     Mental Status: He is alert and oriented to person, place, and time.     Coordination: Coordination normal.  Psychiatric:        Behavior: Behavior normal. Behavior is cooperative.        Thought Content: Thought content normal.        Judgment: Judgment normal.          Patient has been counseled extensively about nutrition and exercise as well as the importance of adherence with medications and regular follow-up. The patient was given clear instructions to go to ER or return to medical center if symptoms don't improve, worsen or new problems develop. The patient verbalized understanding.   Follow-up: Return for 6 months  on a MONDAY.   Haze LELON Servant, FNP-BC [facility] [place], [place] [phone]   02/01/2024, 9:39 AM

## 2024-02-02 LAB — CMP14+EGFR
ALT: 13 IU/L (ref 0–44)
AST: 19 IU/L (ref 0–40)
Albumin: 4.1 g/dL (ref 3.8–4.9)
Alkaline Phosphatase: 89 IU/L (ref 47–123)
BUN/Creatinine Ratio: 13 (ref 10–24)
BUN: 15 mg/dL (ref 8–27)
Bilirubin Total: 0.5 mg/dL (ref 0.0–1.2)
CO2: 23 mmol/L (ref 20–29)
Calcium: 9.2 mg/dL (ref 8.6–10.2)
Chloride: 103 mmol/L (ref 96–106)
Creatinine, Ser: 1.12 mg/dL (ref 0.76–1.27)
Globulin, Total: 3.3 g/dL (ref 1.5–4.5)
Glucose: 98 mg/dL (ref 70–99)
Potassium: 5 mmol/L (ref 3.5–5.2)
Sodium: 140 mmol/L (ref 134–144)
Total Protein: 7.4 g/dL (ref 6.0–8.5)
eGFR: 75 mL/min/1.73 (ref >59)

## 2024-02-02 LAB — LIPID PANEL
Chol/HDL Ratio: 3.7 ratio (ref 0.0–5.0)
Cholesterol, Total: 147 mg/dL (ref 100–199)
HDL: 40 mg/dL (ref >39)
LDL Chol Calc (NIH): 88 mg/dL (ref 0–99)
Triglycerides: 101 mg/dL (ref 0–149)
VLDL Cholesterol Cal: 19 mg/dL (ref 5–40)

## 2024-02-02 LAB — CBC WITH DIFFERENTIAL/PLATELET
Basophils Absolute: 0.1 x10E3/uL (ref 0.0–0.2)
Basos: 1 %
EOS (ABSOLUTE): 0.3 x10E3/uL (ref 0.0–0.4)
Eos: 6 %
Hematocrit: 41.8 % (ref 37.5–51.0)
Hemoglobin: 13.7 g/dL (ref 13.0–17.7)
Immature Grans (Abs): 0 x10E3/uL (ref 0.0–0.1)
Immature Granulocytes: 0 %
Lymphocytes Absolute: 2.2 x10E3/uL (ref 0.7–3.1)
Lymphs: 42 %
MCH: 30.6 pg (ref 26.6–33.0)
MCHC: 32.8 g/dL (ref 31.5–35.7)
MCV: 94 fL (ref 79–97)
Monocytes Absolute: 0.7 x10E3/uL (ref 0.1–0.9)
Monocytes: 12 %
Neutrophils Absolute: 2 x10E3/uL (ref 1.4–7.0)
Neutrophils: 39 %
Platelets: 237 x10E3/uL (ref 150–450)
RBC: 4.47 x10E6/uL (ref 4.14–5.80)
RDW: 11.8 % (ref 11.6–15.4)
WBC: 5.3 x10E3/uL (ref 3.4–10.8)

## 2024-02-02 LAB — HEMOGLOBIN A1C
Est. average glucose Bld gHb Est-mCnc: 114 mg/dL
Hgb A1c MFr Bld: 5.6 % (ref 4.8–5.6)

## 2024-02-02 LAB — PSA: Prostate Specific Ag, Serum: 1.3 ng/mL (ref 0.0–4.0)

## 2024-02-03 ENCOUNTER — Ambulatory Visit: Payer: Self-pay | Admitting: Nurse Practitioner

## 2024-08-11 ENCOUNTER — Ambulatory Visit: Payer: Self-pay | Admitting: Nurse Practitioner
